# Patient Record
Sex: Female | Born: 1955 | Race: Black or African American | Hispanic: No | Marital: Single | Smoking: Current every day smoker
Health system: Southern US, Community
[De-identification: ages and names within clinical notes are randomized; demographics above are authoritative.]

## PROBLEM LIST (undated history)

## (undated) DIAGNOSIS — E7800 Pure hypercholesterolemia, unspecified: Secondary | ICD-10-CM

## (undated) DIAGNOSIS — I10 Essential (primary) hypertension: Secondary | ICD-10-CM

## (undated) DIAGNOSIS — K219 Gastro-esophageal reflux disease without esophagitis: Secondary | ICD-10-CM

## (undated) HISTORY — DX: Pure hypercholesterolemia, unspecified: E78.00

## (undated) HISTORY — DX: Gastro-esophageal reflux disease without esophagitis: K21.9

## (undated) HISTORY — DX: Essential (primary) hypertension: I10

---

## 2007-04-15 HISTORY — PX: OTHER SURGICAL HISTORY: SHX169

## 2007-05-23 ENCOUNTER — Other Ambulatory Visit: Payer: Self-pay

## 2007-05-23 ENCOUNTER — Inpatient Hospital Stay: Payer: Self-pay | Admitting: Internal Medicine

## 2007-05-24 ENCOUNTER — Other Ambulatory Visit: Payer: Self-pay

## 2007-05-25 ENCOUNTER — Other Ambulatory Visit: Payer: Self-pay

## 2007-06-17 ENCOUNTER — Ambulatory Visit: Payer: Self-pay | Admitting: Cardiovascular Disease

## 2009-07-17 IMAGING — CR DG CHEST 1V PORT
1 series · 1 of 1 positions shown · non-contrast
Comparison: none

REASON FOR EXAM: epigastric pain
COMMENTS:

[view not recorded]
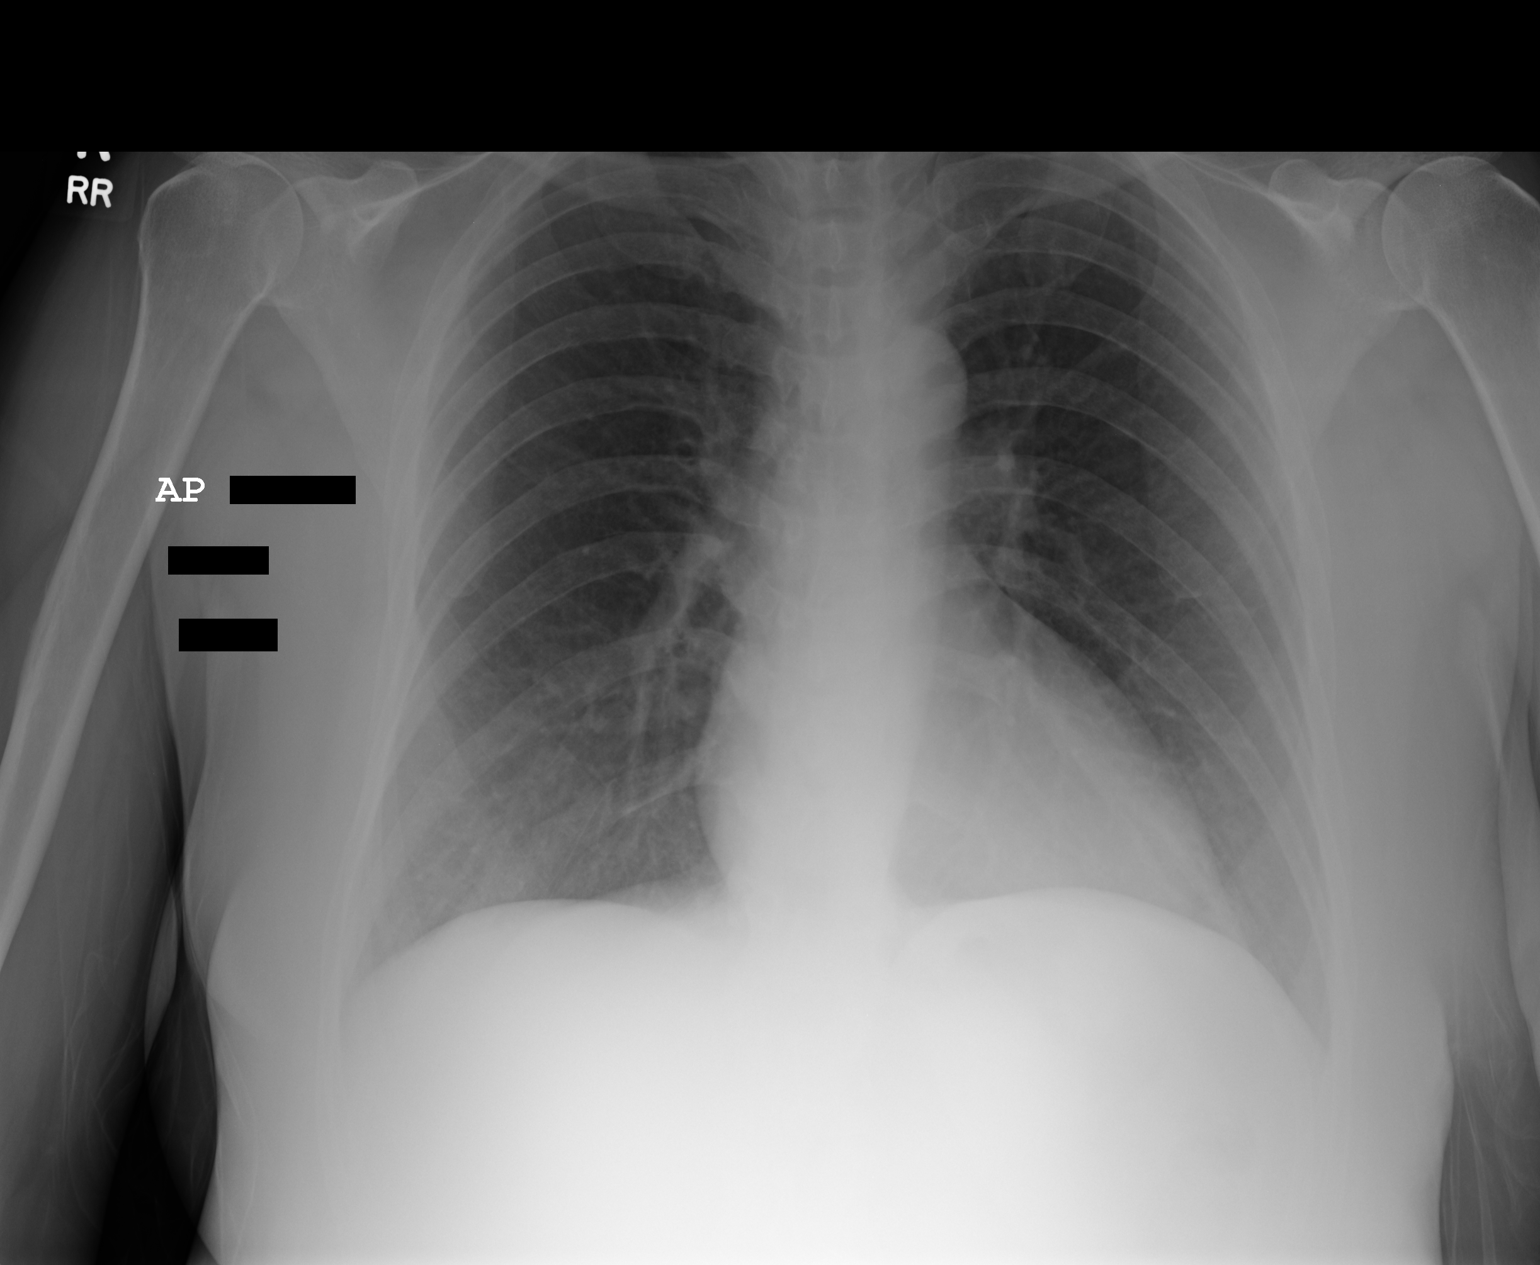

[1 of 1 positions shown; findings below may reference images not displayed]

PROCEDURE:     DXR - DXR PORTABLE CHEST SINGLE VIEW  - May 23, 2007  [DATE]

RESULT:     There is no previous exam for comparison.

The lungs are clear. The heart and pulmonary vessels are normal. The bony
and mediastinal structures are unremarkable. There is no effusion. There is
no pneumothorax or evidence of congestive failure.
IMPRESSION: No acute cardiopulmonary disease.

## 2010-01-29 DIAGNOSIS — I10 Essential (primary) hypertension: Secondary | ICD-10-CM | POA: Insufficient documentation

## 2010-10-29 DIAGNOSIS — J309 Allergic rhinitis, unspecified: Secondary | ICD-10-CM | POA: Insufficient documentation

## 2011-03-12 ENCOUNTER — Emergency Department: Payer: Self-pay

## 2011-03-13 ENCOUNTER — Emergency Department: Payer: Self-pay | Admitting: Emergency Medicine

## 2013-08-01 DIAGNOSIS — Z720 Tobacco use: Secondary | ICD-10-CM | POA: Insufficient documentation

## 2013-10-24 DIAGNOSIS — M7580 Other shoulder lesions, unspecified shoulder: Secondary | ICD-10-CM | POA: Insufficient documentation

## 2014-03-23 DIAGNOSIS — F1910 Other psychoactive substance abuse, uncomplicated: Secondary | ICD-10-CM | POA: Insufficient documentation

## 2014-04-24 DIAGNOSIS — N361 Urethral diverticulum: Secondary | ICD-10-CM | POA: Insufficient documentation

## 2016-06-24 ENCOUNTER — Other Ambulatory Visit: Payer: Self-pay | Admitting: Family Medicine

## 2016-06-24 DIAGNOSIS — Z1239 Encounter for other screening for malignant neoplasm of breast: Secondary | ICD-10-CM

## 2018-03-15 ENCOUNTER — Ambulatory Visit: Payer: Self-pay | Admitting: Obstetrics and Gynecology

## 2018-03-26 ENCOUNTER — Ambulatory Visit (INDEPENDENT_AMBULATORY_CARE_PROVIDER_SITE_OTHER): Payer: Medicare HMO | Admitting: Obstetrics and Gynecology

## 2018-03-26 ENCOUNTER — Encounter: Payer: Self-pay | Admitting: Obstetrics and Gynecology

## 2018-03-26 ENCOUNTER — Other Ambulatory Visit (HOSPITAL_COMMUNITY)
Admission: RE | Admit: 2018-03-26 | Discharge: 2018-03-26 | Disposition: A | Discharge status: Home / Self Care | Payer: Medicare HMO | Source: Ambulatory Visit | Attending: Obstetrics and Gynecology | Admitting: Obstetrics and Gynecology

## 2018-03-26 VITALS — BP 140/66 | HR 96 | Ht 64.0 in | Wt 145.0 lb

## 2018-03-26 DIAGNOSIS — Z1239 Encounter for other screening for malignant neoplasm of breast: Secondary | ICD-10-CM

## 2018-03-26 DIAGNOSIS — Z124 Encounter for screening for malignant neoplasm of cervix: Secondary | ICD-10-CM

## 2018-03-26 DIAGNOSIS — Z01419 Encounter for gynecological examination (general) (routine) without abnormal findings: Secondary | ICD-10-CM | POA: Diagnosis not present

## 2018-03-26 DIAGNOSIS — Z959 Presence of cardiac and vascular implant and graft, unspecified: Secondary | ICD-10-CM

## 2018-03-26 DIAGNOSIS — I739 Peripheral vascular disease, unspecified: Secondary | ICD-10-CM

## 2018-03-26 DIAGNOSIS — Z9889 Other specified postprocedural states: Secondary | ICD-10-CM

## 2018-03-26 NOTE — Progress Notes (Signed)
Gynecology Annual Exam  PCP: Patient, No Pcp Per  Chief Complaint:  Chief Complaint  Patient presents with  . Vaginal pressure    Ovarian cyst/painful intercourse    History of Present Illness:Patient is a 62 y.o. G2P2 presents for annual exam. The patient has no complaints today.   LMP: No LMP recorded. Patient is postmenopausal. No PMB  2The patient does perform self breast exams.  There is no notable family history of breast or ovarian cancer in her family.  The patient wears seatbelts: yes.   The patient has regular exercise: not asked.    The patient denies current symptoms of depression.     Review of Systems: Review of Systems  Constitutional: Negative for chills and fever.  HENT: Negative for congestion.   Respiratory: Negative for cough and shortness of breath.   Cardiovascular: Positive for claudication. Negative for chest pain and palpitations.  Gastrointestinal: Negative for abdominal pain, constipation, diarrhea, heartburn, nausea and vomiting.  Genitourinary: Positive for frequency and urgency. Negative for dysuria, flank pain and hematuria.  Musculoskeletal: Positive for myalgias.  Skin: Negative for itching and rash.  Neurological: Negative for dizziness and headaches.  Endo/Heme/Allergies: Negative for polydipsia.  Psychiatric/Behavioral: Negative for depression.    Past Medical History:  Past Medical History:  Diagnosis Date  . GERD (gastroesophageal reflux disease)   . Hypercholesteremia   . Hypertension     Past Surgical History:  Past Surgical History:  Procedure Laterality Date  . Stent implant  [facility]  Right Iliac     Gynecologic History:  No LMP recorded. Patient is postmenopausal. Last mammogram: 10/26/2017 Results were: [facility]  Obstetric History: G2P2  Family History:  Family History  Problem Relation Age of Onset  . Pancreatic cancer Mother   . Throat cancer Father   . Throat cancer Sister     Social  History:  Social History   Socioeconomic History  . Marital status: Single    Spouse name: Not on file  . Number of children: Not on file  . Years of education: Not on file  . Highest education level: Not on file  Occupational History  . Not on file  Social Needs  . Financial resource strain: Not on file  . Food insecurity:    Worry: Not on file    Inability: Not on file  . Transportation needs:    Medical: Not on file    Non-medical: Not on file  Tobacco Use  . Smoking status: Current Every Day Smoker    Types: Cigarettes  . Smokeless tobacco: Never Used  Substance and Sexual Activity  . Alcohol use: Yes    Comment: Occ  . Drug use: Yes    Types: Cocaine  . Sexual activity: Not Currently    Partners: Male    Birth control/protection: None  Lifestyle  . Physical activity:    Days per week: Not on file    Minutes per session: Not on file  . Stress: Not on file  Relationships  . Social connections:    Talks on phone: Not on file    Gets together: Not on file    Attends religious service: Not on file    Active member of club or organization: Not on file    Attends meetings of clubs or organizations: Not on file    Relationship status: Not on file  . Intimate partner violence:    Fear of current or ex partner: Not on  file    Emotionally abused: Not on file    Physically abused: Not on file    Forced sexual activity: Not on file  Other Topics Concern  . Not on file  Social History Narrative  . Not on file    Allergies:  Allergies  Allergen Reactions  . Sulfa Antibiotics Swelling    Medications: Prior to Admission medications   Medication Sig Start Date End Date Taking? Authorizing Provider  albuterol (PROVENTIL HFA;VENTOLIN HFA) 108 (90 Base) MCG/ACT inhaler Inhale into the lungs. 09/30/17 09/30/18 Yes [provider]  amlodipine-atorvastatin (CADUET) 10-10 MG tablet Take 1 tablet by mouth daily.   Yes [provider]  atorvastatin  (LIPITOR) 20 MG tablet Take by mouth. 05/04/15  Yes [provider]  bisacodyl (DULCOLAX) 5 MG EC tablet Take by mouth.   Yes [provider]  fluticasone (FLONASE) 50 MCG/ACT nasal spray SPRAY 1 TO 2 SPRAYS IN EACH NOSTRIL AT BEDTIME 05/28/16  Yes [provider]  guaifenesin (ROBITUSSIN) 100 MG/5ML syrup Take by mouth. 09/30/17  Yes [provider]  losartan (COZAAR) 50 MG tablet Take by mouth. 01/10/16  Yes [provider]  nitroGLYCERIN (NITROSTAT) 0.4 MG SL tablet PLACE 1 TAB UNDER TONGUE EVERY 5 MIN AS NEEDED FOR CHEST PAIN-IF USE 3 TABS OR PAIN PERSISTS-GO TO E 10/31/15  Yes [provider]  omeprazole (PRILOSEC) 20 MG capsule Take by mouth. 12/19/13  Yes [provider]  oxybutynin (DITROPAN) 5 MG tablet Take 5 mg by mouth 3 (three) times daily.   Yes [provider]  ranitidine (ZANTAC) 150 MG tablet Take by mouth. 02/26/15  Yes [provider]    Physical Exam Vitals: Blood pressure 140/66, pulse 96, height 5\' 4"  (1.626 m), weight 145 lb (65.8 kg).  General: NAD HEENT: normocephalic, anicteric Thyroid: no enlargement, no palpable nodules Pulmonary: No increased work of breathing, CTAB Cardiovascular: RRR, distal pulses 2+ Breast: Breast symmetrical, no tenderness, no palpable nodules or masses, no skin or nipple retraction present, no nipple discharge.  No axillary or supraclavicular lymphadenopathy. Abdomen: NABS, soft, non-tender, non-distended.  Umbilicus without lesions.  No hepatomegaly, splenomegaly or masses palpable. No evidence of hernia  Genitourinary:  External: Normal external female genitalia.  Normal urethral meatus, normal Bartholin's and Skene's glands.    Vagina: Normal vaginal mucosa, no evidence of prolapse.    Cervix: Grossly normal in appearance, no bleeding  Uterus: Non-enlarged, mobile, normal contour.  No CMT  Adnexa: ovaries non-enlarged, no adnexal masses  Rectal:  deferred  Lymphatic: no evidence of inguinal lymphadenopathy Extremities: no edema, erythema, or tenderness Neurologic: Grossly intact Psychiatric: mood appropriate, affect full  Female chaperone present for pelvic and breast  portions of the physical exam     Assessment: 62 y.o. 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  Mammogram Is up to date  2) STI screening  was notoffered and therefore not obtained  3) ASCCP guidelines  and rational discussed.  Patient opts for every 3 years screening interval  4) Osteoporosis  - per USPTF routine screening DEXA at age 30  5) Routine healthcare maintenance including cholesterol, diabetes screening discussed managed by PCP  6) Colonoscopy -  states had 2 years ago [facility] or [facility].  I only see [facility] colonoscopy documented 2009 in [facility] records  8) Had a fall recently - does report leg numbness and tingling referral to vascular .  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    Vena Austria, MD Domingo Pulse, [facility] 03/26/2018, 9:32 AM

## 2018-03-31 ENCOUNTER — Encounter (INDEPENDENT_AMBULATORY_CARE_PROVIDER_SITE_OTHER): Payer: Medicare HMO | Admitting: Vascular Surgery

## 2018-04-02 LAB — CYTOLOGY - PAP: HPV: NOT DETECTED

## 2018-04-05 ENCOUNTER — Telehealth: Payer: Self-pay | Admitting: Obstetrics and Gynecology

## 2018-04-05 ENCOUNTER — Encounter (INDEPENDENT_AMBULATORY_CARE_PROVIDER_SITE_OTHER): Payer: Self-pay | Admitting: Nurse Practitioner

## 2018-04-05 ENCOUNTER — Ambulatory Visit (INDEPENDENT_AMBULATORY_CARE_PROVIDER_SITE_OTHER): Payer: Medicare HMO | Admitting: Nurse Practitioner

## 2018-04-05 VITALS — BP 129/70 | HR 91 | Resp 14 | Ht 64.0 in | Wt 139.6 lb

## 2018-04-05 DIAGNOSIS — Z720 Tobacco use: Secondary | ICD-10-CM

## 2018-04-05 DIAGNOSIS — I739 Peripheral vascular disease, unspecified: Secondary | ICD-10-CM

## 2018-04-05 DIAGNOSIS — I10 Essential (primary) hypertension: Secondary | ICD-10-CM

## 2018-04-05 DIAGNOSIS — E782 Mixed hyperlipidemia: Secondary | ICD-10-CM | POA: Diagnosis not present

## 2018-04-05 NOTE — Telephone Encounter (Signed)
Patient requesting annual lab work including A1C, she was seen on 12/13 for NP consult.

## 2018-04-06 ENCOUNTER — Other Ambulatory Visit: Payer: Self-pay | Admitting: Obstetrics and Gynecology

## 2018-04-06 DIAGNOSIS — Z131 Encounter for screening for diabetes mellitus: Secondary | ICD-10-CM

## 2018-04-06 DIAGNOSIS — Z13220 Encounter for screening for lipoid disorders: Secondary | ICD-10-CM

## 2018-04-06 DIAGNOSIS — Z1329 Encounter for screening for other suspected endocrine disorder: Secondary | ICD-10-CM

## 2018-04-06 NOTE — Telephone Encounter (Signed)
Put in future orders if we can schedule her lab visit

## 2018-04-08 ENCOUNTER — Encounter (INDEPENDENT_AMBULATORY_CARE_PROVIDER_SITE_OTHER): Payer: Self-pay | Admitting: Nurse Practitioner

## 2018-04-08 NOTE — Progress Notes (Signed)
Subjective:    Patient ID: Rebekah Perry, female    DOB: 07-01-1955, 62 y.o.   MRN: [id number] Chief Complaint  Patient presents with  . New Patient (Initial Visit)    HPI  Rebekah Perry is a 62 y.o. female that presents as a new patient that is referred by Dr. Bonney Aid.  The patient states that essentially she has multiple issues with pain in her bilateral lower extremities.  She states that when she lays on her right side her right lower extremity goes numb.  She also has extensive cramping at night of her legs and feet.  She states that when she is ambulating she has bilateral leg weakness which necessitates her needing to stop and take a break at multiple intervals.  She states that she can walk approximately 100 to 200 feet reporting to stop.  The patient is unsure of the dates but endorses that she has had multiple vascular interventions.  She states that she has stents in both of her legs.  She states that she has had a bypass, pointing to a long midline scar.  She has some stent cards which indicates stents in her iliac arteries.  The patient is a current smoker.  Patient denies any wounds or ulcerations of her lower extremities.  She denies any numbness or tingling of her bilateral lower extremities.  She denies any previous history of DVT.  She denies any chest pain or shortness of breath.  She denies any fever, chills, nausea, vomiting or diarrhea.  Past Medical History:  Diagnosis Date  . GERD (gastroesophageal reflux disease)   . Hypercholesteremia   . Hypertension     Past Surgical History:  Procedure Laterality Date  . Stent implant  2009   [facility]  Right Iliac     Social History   Socioeconomic History  . Marital status: Single    Spouse name: Not on file  . Number of children: Not on file  . Years of education: Not on file  . Highest education level: Not on file  Occupational History  . Not on file  Social Needs  . Financial resource strain: Not on  file  . Food insecurity:    Worry: Not on file    Inability: Not on file  . Transportation needs:    Medical: Not on file    Non-medical: Not on file  Tobacco Use  . Smoking status: Current Every Day Smoker    Types: Cigarettes  . Smokeless tobacco: Never Used  Substance and Sexual Activity  . Alcohol use: Yes    Comment: Occ  . Drug use: Yes    Types: Cocaine  . Sexual activity: Not Currently    Partners: Male    Birth control/protection: None  Lifestyle  . Physical activity:    Days per week: Not on file    Minutes per session: Not on file  . Stress: Not on file  Relationships  . Social connections:    Talks on phone: Not on file    Gets together: Not on file    Attends religious service: Not on file    Active member of club or organization: Not on file    Attends meetings of clubs or organizations: Not on file    Relationship status: Not on file  . Intimate partner violence:    Fear of current or ex partner: Not on file    Emotionally abused: Not on file    Physically abused: Not on file  Forced sexual activity: Not on file  Other Topics Concern  . Not on file  Social History Narrative  . Not on file    Family History  Problem Relation Age of Onset  . Pancreatic cancer Mother   . Throat cancer Father   . Throat cancer Sister     Allergies  Allergen Reactions  . Sulfa Antibiotics Swelling     Review of Systems   Review of Systems: Negative Unless Checked Constitutional: [] Weight loss  [] Fever  [] Chills Cardiac: [] Chest pain   []  Atrial Fibrillation  [] Palpitations   [] Shortness of breath when laying flat   [] Shortness of breath with exertion. [] Shortness of breath at rest Vascular:  [x] Pain in legs with walking   [] Pain in legs with standing [] Pain in legs when laying flat   [x] Claudication    [x] Pain in feet when laying flat    [] History of DVT   [] Phlebitis   [] Swelling in legs   [] Varicose veins   [] Non-healing ulcers Pulmonary:   [] Uses home  oxygen   [] Productive cough   [] Hemoptysis   [] Wheeze  [] COPD   [] Asthma Neurologic:  [] Dizziness   [] Seizures  [] Blackouts [] History of stroke   [] History of TIA  [] Aphasia   [] Temporary Blindness   [] Weakness or numbness in arm   [x] Weakness or numbness in leg Musculoskeletal:   [] Joint swelling   [] Joint pain   [] Low back pain  []  History of Knee Replacement [] Arthritis [] back Surgeries  []  Spinal Stenosis    Hematologic:  [] Easy bruising  [] Easy bleeding   [] Hypercoagulable state   [] Anemic Gastrointestinal:  [] Diarrhea   [] Vomiting  [x] Gastroesophageal reflux/heartburn   [] Difficulty swallowing. [] Abdominal pain Genitourinary:  [] Chronic kidney disease   [] Difficult urination  [] Anuric   [] Blood in urine [x] Frequent urination  [] Burning with urination   [] Hematuria Skin:  [] Rashes   [] Ulcers [] Wounds Psychological:  [] History of anxiety   []  History of major depression  []  Memory Difficulties     Objective:   Physical Exam  BP 129/70 (BP Location: Right Arm, Patient Position: Sitting)   Pulse 91   Resp 14   Ht 5\' 4"  (1.626 m)   Wt 139 lb 9.6 oz (63.3 kg)   BMI 23.96 kg/m   Gen: WD/WN, NAD Head: [place]/AT, No temporalis wasting.  Ear/Nose/Throat: Hearing grossly intact, nares w/o erythema or drainage Eyes: PER, EOMI, sclera nonicteric.  Neck: Supple, no masses.  No JVD.  Pulmonary:  Good air movement, no use of accessory muscles.  Cardiac: RRR Vascular:  Vessel Right Left  Radial Palpable Palpable  Dorsalis Pedis Not Palpable Not Palpable  Posterior Tibial Not Palpable Not Palpable   Gastrointestinal: soft, non-distended. No guarding/no peritoneal signs.  Midline scar Musculoskeletal: M/S 5/5 throughout.  No deformity or atrophy.  Neurologic: Pain and light touch intact in extremities.  Symmetrical.  Speech is fluent. Motor exam as listed above. Psychiatric: Judgment intact, Mood & affect appropriate for pt's clinical situation. Dermatologic: No Venous rashes. No Ulcers Noted.   No changes consistent with cellulitis. Lymph : No Cervical lymphadenopathy, no lichenification or skin changes of chronic lymphedema.      Assessment & Plan:   1. Peripheral vascular disease (HCC) Based upon the patient's description of previous surgery I believe what she is describing is an aortobifemoral bypass.  [facility], it appears that at some point in 2015 it was discovered that she had an occlusion of her aortobifem.  There was also evidence of some collateralization.  It also appears that since that time she has not had any follow-up studies it is also unclear as to whether she has been under the care of a vascular physician since that time.  We will obtain bilateral ABIs to establish a baseline.  We will also get bilateral lower extremity arterial duplexes as well as an aortoiliac duplex to determine the status of her bypass grafts, stents as well as current status of her flows.  Depending on the findings of these studies we will determine what the next steps will be, such as further noninvasive imaging or a possible diagnostic angiogram.  She will follow-up with me following the studies.  - VAS US AORTA/IVC/ILIACS; Future - VAS US LOWER EXTREMITY ARTERIAL DUPLEX; Future - VAS US ABI WITH/WO TBI; Future  2. Essential hypertension Continue antihypertensive medications as already ordered, these medications have been reviewed and there are no changes at this time.   3. Mixed hyperlipidemia Continue statin as ordered and reviewed, no changes at this time   4. Tobacco abuse Smoking cessation was discussed, 3-10 minutes spent on this topic specifically    Current Outpatient Medications on File Prior to Visit  Medication Sig Dispense Refill  . albuterol (PROVENTIL HFA;VENTOLIN HFA) 108 (90 Base) MCG/ACT inhaler Inhale into the lungs.    Marland Kitchen. amlodipine-atorvastatin (CADUET) 10-10 MG tablet Take 1 tablet by mouth daily.    Marland Kitchen. atorvastatin (LIPITOR) 20 MG  tablet Take by mouth.    . bisacodyl (DULCOLAX) 5 MG EC tablet Take by mouth.    . fluticasone (FLONASE) 50 MCG/ACT nasal spray SPRAY 1 TO 2 SPRAYS IN EACH NOSTRIL AT BEDTIME    . losartan (COZAAR) 50 MG tablet Take by mouth.    . nitroGLYCERIN (NITROSTAT) 0.4 MG SL tablet PLACE 1 TAB UNDER TONGUE EVERY 5 MIN AS NEEDED FOR CHEST PAIN-IF USE 3 TABS OR PAIN PERSISTS-GO TO E    . omeprazole (PRILOSEC) 20 MG capsule Take by mouth.    . oxybutynin (DITROPAN) 5 MG tablet Take 5 mg by mouth 3 (three) times daily.    . ranitidine (ZANTAC) 150 MG tablet Take by mouth.     No current facility-administered medications on file prior to visit.     There are no Patient Instructions on file for this visit. No follow-ups on file.   Georgiana SpinnerFallon E Joniece Smotherman, NP  This note was completed with Office managerDragon Dictation.  Any errors are purely unintentional.

## 2018-04-13 ENCOUNTER — Other Ambulatory Visit: Payer: Medicare HMO

## 2018-04-13 ENCOUNTER — Telehealth: Payer: Self-pay | Admitting: Obstetrics and Gynecology

## 2018-04-13 DIAGNOSIS — Z1329 Encounter for screening for other suspected endocrine disorder: Secondary | ICD-10-CM

## 2018-04-13 DIAGNOSIS — Z131 Encounter for screening for diabetes mellitus: Secondary | ICD-10-CM

## 2018-04-13 DIAGNOSIS — Z13220 Encounter for screening for lipoid disorders: Secondary | ICD-10-CM

## 2018-04-13 NOTE — Telephone Encounter (Signed)
Result was abnormal, however, I do not know his plan for future testing. Deferring to AMS to review and call pt at his return to office. KJ CMA

## 2018-04-13 NOTE — Telephone Encounter (Signed)
Patient would like call regarding pap results, AMS had called her but she cannot remember results (?mass).  Patient aware AMS out of office until 1/6.  If nurse could call or she will wait until his return.

## 2018-04-14 LAB — CMP14+LP+TP+TSH+CBC/PLT
ALT: 10 IU/L (ref 0–32)
AST: 11 IU/L (ref 0–40)
Albumin/Globulin Ratio: 1.4 (ref 1.2–2.2)
Albumin: 3.9 g/dL (ref 3.6–4.8)
Alkaline Phosphatase: 81 IU/L (ref 39–117)
BUN/Creatinine Ratio: 11 — ABNORMAL LOW (ref 12–28)
BUN: 8 mg/dL (ref 8–27)
Bilirubin Total: 0.3 mg/dL (ref 0.0–1.2)
CO2: 24 mmol/L (ref 20–29)
Calcium: 9.7 mg/dL (ref 8.7–10.3)
Chloride: 104 mmol/L (ref 96–106)
Cholesterol, Total: 168 mg/dL (ref 100–199)
Creatinine, Ser: 0.74 mg/dL (ref 0.57–1.00)
Free Thyroxine Index: 1.6 (ref 1.2–4.9)
GFR calc Af Amer: 100 mL/min/{1.73_m2} (ref >59)
GFR calc non Af Amer: 87 mL/min/{1.73_m2} (ref >59)
Globulin, Total: 2.7 g/dL (ref 1.5–4.5)
Glucose: 97 mg/dL (ref 65–99)
HDL: 58 mg/dL (ref >39)
Hematocrit: 39.2 % (ref 34.0–46.6)
Hemoglobin: 12.9 g/dL (ref 11.1–15.9)
LDL Calculated: 77 mg/dL (ref 0–99)
LDl/HDL Ratio: 1.3 ratio (ref 0.0–3.2)
MCH: 27.7 pg (ref 26.6–33.0)
MCHC: 32.9 g/dL (ref 31.5–35.7)
MCV: 84 fL (ref 79–97)
Platelets: 318 10*3/uL (ref 150–450)
Potassium: 3.9 mmol/L (ref 3.5–5.2)
RBC: 4.66 x10E6/uL (ref 3.77–5.28)
RDW: 15.6 % — ABNORMAL HIGH (ref 12.3–15.4)
Sodium: 144 mmol/L (ref 134–144)
T3 Uptake Ratio: 25 % (ref 24–39)
T4, Total: 6.5 ug/dL (ref 4.5–12.0)
TSH: 4 u[IU]/mL (ref 0.450–4.500)
Total Protein: 6.6 g/dL (ref 6.0–8.5)
Triglycerides: 167 mg/dL — ABNORMAL HIGH (ref 0–149)
VLDL Cholesterol Cal: 33 mg/dL (ref 5–40)
WBC: 8.4 10*3/uL (ref 3.4–10.8)

## 2018-04-14 LAB — HEMOGLOBIN A1C
Est. average glucose Bld gHb Est-mCnc: 126 mg/dL
HEMOGLOBIN A1C: 6 % — AB (ref 4.8–5.6)

## 2018-04-19 NOTE — Telephone Encounter (Signed)
Patient is calling for labs. Please advise

## 2018-04-28 ENCOUNTER — Encounter (INDEPENDENT_AMBULATORY_CARE_PROVIDER_SITE_OTHER): Payer: Self-pay | Admitting: Nurse Practitioner

## 2018-04-28 ENCOUNTER — Ambulatory Visit (INDEPENDENT_AMBULATORY_CARE_PROVIDER_SITE_OTHER): Payer: Medicare HMO | Admitting: Vascular Surgery

## 2018-04-28 ENCOUNTER — Ambulatory Visit (INDEPENDENT_AMBULATORY_CARE_PROVIDER_SITE_OTHER): Payer: Medicare HMO

## 2018-04-28 VITALS — BP 147/68 | HR 85 | Resp 16 | Ht 64.0 in | Wt 143.6 lb

## 2018-04-28 DIAGNOSIS — E782 Mixed hyperlipidemia: Secondary | ICD-10-CM | POA: Diagnosis not present

## 2018-04-28 DIAGNOSIS — I739 Peripheral vascular disease, unspecified: Secondary | ICD-10-CM

## 2018-04-28 DIAGNOSIS — I10 Essential (primary) hypertension: Secondary | ICD-10-CM | POA: Diagnosis not present

## 2018-04-28 DIAGNOSIS — Z720 Tobacco use: Secondary | ICD-10-CM

## 2018-04-28 DIAGNOSIS — R1084 Generalized abdominal pain: Secondary | ICD-10-CM

## 2018-05-03 ENCOUNTER — Encounter (INDEPENDENT_AMBULATORY_CARE_PROVIDER_SITE_OTHER): Payer: Self-pay | Admitting: Vascular Surgery

## 2018-05-03 DIAGNOSIS — R1084 Generalized abdominal pain: Secondary | ICD-10-CM | POA: Insufficient documentation

## 2018-05-03 NOTE — Progress Notes (Signed)
Subjective:    Patient ID: Rebekah Perry, female    DOB: 01/23/1956, 63 y.o.   MRN: [id number] Chief Complaint  Patient presents with  . Follow-up   Patient presents to review vascular studies.  Patient was last seen on April 05, 2018 in regard to reestablishing care.  The patient has a known past medical history of peripheral artery disease requiring endovascular and open repair.  The patient presents today without complaint with the exception of some generalized abdominal pain.  Patient states she feels like she is unable to " digest her food".  Patient experiences some "nausea" after eating however denies any vomiting. Patient denies any claudication-like symptoms, rest pain or ulcer formation to the bilateral lower extremity.  The patient underwent a bilateral ABI which is notable for monophasic tibials and abnormal great toe pressure waveforms.  Right ABI: 0.52, Left ABI:0.55.  The patient underwent an aortoiliac and bilateral lower extremity arterial duplex which was notable for monophasic blood flow throughout.  Patient denies any fever, nausea vomiting.  Review of Systems  Constitutional: Negative.   HENT: Negative.   Eyes: Negative.   Respiratory: Negative.   Cardiovascular: Negative.   Gastrointestinal: Negative.   Endocrine: Negative.   Genitourinary: Negative.   Musculoskeletal: Negative.   Skin: Negative.   Allergic/Immunologic: Negative.   Neurological: Negative.   Hematological: Negative.   Psychiatric/Behavioral: Negative.       Objective:   Physical Exam Constitutional:      Appearance: Normal appearance. She is normal weight.  HENT:     Head: Normocephalic and atraumatic.     Right Ear: External ear normal.     Left Ear: External ear normal.     Nose: Nose normal.     Mouth/Throat:     Mouth: Mucous membranes are dry.     Pharynx: Oropharynx is clear.  Eyes:     Extraocular Movements: Extraocular movements intact.     Conjunctiva/sclera: Conjunctivae  normal.     Pupils: Pupils are equal, round, and reactive to light.  Neck:     Musculoskeletal: Normal range of motion.  Cardiovascular:     Rate and Rhythm: Normal rate and regular rhythm.     Comments: Hard to palpate pedal pulses bilaterally however the bilateral feet are warm Pulmonary:     Effort: Pulmonary effort is normal.     Breath sounds: Normal breath sounds.  Musculoskeletal: Normal range of motion.        General: Swelling (Mild bilateral nonpitting edema noted) present.  Skin:    General: Skin is warm and dry.  Neurological:     General: No focal deficit present.     Mental Status: She is alert and oriented to person, place, and time. Mental status is at baseline.  Psychiatric:        Mood and Affect: Mood normal.        Behavior: Behavior normal.        Thought Content: Thought content normal.        Judgment: Judgment normal.    BP (!) 147/68 (BP Location: Right Arm, Patient Position: Sitting)   Pulse 85   Resp 16   Ht 5\' 4"  (1.626 m)   Wt 143 lb 9.6 oz (65.1 kg)   BMI 24.65 kg/m   Past Medical History:  Diagnosis Date  . GERD (gastroesophageal reflux disease)   . Hypercholesteremia   . Hypertension    Social History   Socioeconomic History  . Marital status: Single  Spouse name: Not on file  . Number of children: Not on file  . Years of education: Not on file  . Highest education level: Not on file  Occupational History  . Not on file  Social Needs  . Financial resource strain: Not on file  . Food insecurity:    Worry: Not on file    Inability: Not on file  . Transportation needs:    Medical: Not on file    Non-medical: Not on file  Tobacco Use  . Smoking status: Current Every Day Smoker    Types: Cigarettes  . Smokeless tobacco: Never Used  Substance and Sexual Activity  . Alcohol use: Yes    Comment: Occ  . Drug use: Yes    Types: Cocaine  . Sexual activity: Not Currently    Partners: Male    Birth control/protection: None    Lifestyle  . Physical activity:    Days per week: Not on file    Minutes per session: Not on file  . Stress: Not on file  Relationships  . Social connections:    Talks on phone: Not on file    Gets together: Not on file    Attends religious service: Not on file    Active member of club or organization: Not on file    Attends meetings of clubs or organizations: Not on file    Relationship status: Not on file  . Intimate partner violence:    Fear of current or ex partner: Not on file    Emotionally abused: Not on file    Physically abused: Not on file    Forced sexual activity: Not on file  Other Topics Concern  . Not on file  Social History Narrative  . Not on file   Past Surgical History:  Procedure Laterality Date  . Stent implant  2009   [facility]  Right Iliac    Family History  Problem Relation Age of Onset  . Pancreatic cancer Mother   . Throat cancer Father   . Throat cancer Sister    Allergies  Allergen Reactions  . Sulfa Antibiotics Swelling      Assessment & Plan:  Patient presents to review vascular studies.  Patient was last seen on April 05, 2018 in regard to reestablishing care.  The patient has a known past medical history of peripheral artery disease requiring endovascular and open repair.  The patient presents today without complaint with the exception of some generalized abdominal pain.  Patient states she feels like she is unable to " digest her food".  Patient experiences some "nausea" after eating however denies any vomiting. Patient denies any claudication-like symptoms, rest pain or ulcer formation to the bilateral lower extremity.  The patient underwent a bilateral ABI which is notable for monophasic tibials and abnormal great toe pressure waveforms.  Right ABI: 0.52, Left ABI:0.55.  The patient underwent an aortoiliac and bilateral lower extremity arterial duplex which was notable for monophasic blood flow throughout.  Patient denies any fever,  nausea vomiting.  1. Peripheral vascular disease (HCC) - Stable Patient with a known history of peripheral artery disease Patient with monophasic blood flow in the aorta throughout the iliacs and distally At this time, the patient is not complaining of any claudication-like symptoms, rest pain or ulcer formation We did have a long discussion about possibly moving forward with a bilateral lower extremity angiogram to improve her blood flow however at this time the patient does not want to move  forward The patient does understand that this type of disease is progressive and she may to undergo one in the near future I will bring the patient back in 6 months to repeat studies to surveilled her disease We discussed the symptoms of peripheral artery disease such as claudication, rest pain or ulcer formation.  The patient knows to call the office sooner if she starts to experience any of the symptoms  - VAS [place] ABI WITH/WO TBI; Future - VAS US AORTA/IVC/ILIACS; Future - VAS [place] LOWER EXTREMITY ARTERIAL DUPLEX; Future  2. Essential hypertension - Stable Encouraged good control as its slows the progression of atherosclerotic disease  3. Tobacco abuse - Stable We had a discussion for approximately three minutes regarding the absolute need for smoking cessation due to the deleterious nature of tobacco on the vascular system. We discussed the tobacco use would diminish patency of any intervention, and likely significantly worsen progressio of disease. We discussed multiple agents for quitting including replacement therapy or medications to reduce cravings such as Chantix. The patient voices their understanding of the importance of smoking cessation.  4. Mixed hyperlipidemia - Stable Encouraged good control as its slows the progression of atherosclerotic disease  5. Generalized abdominal pain - New Patient is concerned that she may have "blockages" in the arteries in her stomach. Patient does have many  risk factors for mesenteric artery stenosis Patient does experience generalized abdominal pain which she states is progressively worsening I will bring her back at her convenience and have her undergo a mesenteric artery duplex to rule out any contributing mesenteric stenosis  - VAS [place] MESENTERIC DUPLEX; Future  Current Outpatient Medications on File Prior to Visit  Medication Sig Dispense Refill  . albuterol (PROVENTIL HFA;VENTOLIN HFA) 108 (90 Base) MCG/ACT inhaler Inhale into the lungs.    Marland Kitchen amlodipine-atorvastatin (CADUET) 10-10 MG tablet Take 1 tablet by mouth daily.    Marland Kitchen atorvastatin (LIPITOR) 20 MG tablet Take by mouth.    . bisacodyl (DULCOLAX) 5 MG EC tablet Take by mouth.    . fluticasone (FLONASE) 50 MCG/ACT nasal spray SPRAY 1 TO 2 SPRAYS IN EACH NOSTRIL AT BEDTIME    . losartan (COZAAR) 50 MG tablet Take by mouth.    . nitroGLYCERIN (NITROSTAT) 0.4 MG SL tablet PLACE 1 TAB UNDER TONGUE EVERY 5 MIN AS NEEDED FOR CHEST PAIN-IF USE 3 TABS OR PAIN PERSISTS-GO TO E    . omeprazole (PRILOSEC) 20 MG capsule Take by mouth.    . oxybutynin (DITROPAN) 5 MG tablet Take 5 mg by mouth 3 (three) times daily.     No current facility-administered medications on file prior to visit.     There are no Patient Instructions on file for this visit. No follow-ups on file.   Elzina Devera A Brandace Cargle, PA-C

## 2018-05-12 ENCOUNTER — Encounter (INDEPENDENT_AMBULATORY_CARE_PROVIDER_SITE_OTHER): Payer: Self-pay | Admitting: Vascular Surgery

## 2018-05-12 ENCOUNTER — Ambulatory Visit (INDEPENDENT_AMBULATORY_CARE_PROVIDER_SITE_OTHER): Payer: Medicare HMO | Admitting: Vascular Surgery

## 2018-05-12 ENCOUNTER — Ambulatory Visit (INDEPENDENT_AMBULATORY_CARE_PROVIDER_SITE_OTHER): Payer: Medicare HMO

## 2018-05-12 VITALS — BP 116/68 | HR 80 | Resp 16 | Ht 64.0 in | Wt 142.6 lb

## 2018-05-12 DIAGNOSIS — R1084 Generalized abdominal pain: Secondary | ICD-10-CM | POA: Diagnosis not present

## 2018-05-12 DIAGNOSIS — E782 Mixed hyperlipidemia: Secondary | ICD-10-CM | POA: Diagnosis not present

## 2018-05-12 DIAGNOSIS — Z720 Tobacco use: Secondary | ICD-10-CM | POA: Diagnosis not present

## 2018-05-12 DIAGNOSIS — I739 Peripheral vascular disease, unspecified: Secondary | ICD-10-CM | POA: Diagnosis not present

## 2018-05-12 DIAGNOSIS — I10 Essential (primary) hypertension: Secondary | ICD-10-CM

## 2018-05-12 NOTE — Progress Notes (Signed)
Subjective:    Patient ID: Rebekah Perry, female    DOB: 10/26/1955, 63 y.o.   MRN: [id number] Chief Complaint  Patient presents with  . Follow-up   Patient presents to review vascular studies.  The patient was last seen on April 28, 2018 in regard to known peripheral artery disease.  At the time, the patient noted that she experienced intermittent generalized abdominal pain.  Due to her history with peripheral artery disease, we decided to move forward and rule out any mesenteric ischemia as a contributing factor.  The patient notes that her abdominal symptoms are stable.  The patient underwent a mesenteric duplex which was notable for no hemodynamically significant arterial stenosis noted in the cephalic, common hepatic, splenic and superior mesenteric artery.  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  Patient denies any fever, nausea vomiting.  Review of Systems  Constitutional: Negative.   HENT: Negative.   Eyes: Negative.   Respiratory: Negative.   Cardiovascular: Negative.   Gastrointestinal: Positive for abdominal pain.  Endocrine: Negative.   Genitourinary: Negative.   Musculoskeletal: Negative.   Skin: Negative.   Allergic/Immunologic: Negative.   Neurological: Negative.   Hematological: Negative.   Psychiatric/Behavioral: Negative.       Objective:   Physical Exam Vitals signs reviewed.  Constitutional:      Appearance: Normal appearance.  HENT:     Head: Normocephalic and atraumatic.     Right Ear: External ear normal.     Left Ear: External ear normal.     Nose: Nose normal.     Mouth/Throat:     Mouth: Mucous membranes are moist.     Pharynx: Oropharynx is clear.  Eyes:     Extraocular Movements: Extraocular movements intact.     Conjunctiva/sclera: Conjunctivae normal.     Pupils: Pupils are equal, round, and reactive to light.  Neck:     Musculoskeletal: Normal range of motion.  Cardiovascular:     Rate and Rhythm: Normal rate and  regular rhythm.     Comments: To palpate pedal pulses however the bilateral feet are warm. Pulmonary:     Effort: Pulmonary effort is normal.     Breath sounds: Normal breath sounds.  Abdominal:     General: Bowel sounds are normal. There is no distension.     Palpations: Abdomen is soft.     Tenderness: There is no abdominal tenderness. There is no guarding or rebound.  Musculoskeletal: Normal range of motion.        General: No swelling.  Skin:    General: Skin is warm and dry.  Neurological:     General: No focal deficit present.     Mental Status: She is alert and oriented to person, place, and time. Mental status is at baseline.  Psychiatric:        Mood and Affect: Mood normal.        Behavior: Behavior normal.        Thought Content: Thought content normal.        Judgment: Judgment normal.    BP 116/68 (BP Location: Right Arm, Patient Position: Sitting)   Pulse 80   Resp 16   Ht 5\' 4"  (1.626 m)   Wt 142 lb 9.6 oz (64.7 kg)   BMI 24.48 kg/m   Past Medical History:  Diagnosis Date  . GERD (gastroesophageal reflux disease)   . Hypercholesteremia   . Hypertension    Social History   Socioeconomic History  .  Marital status: Single    Spouse name: Not on file  . Number of children: Not on file  . Years of education: Not on file  . Highest education level: Not on file  Occupational History  . Not on file  Social Needs  . Financial resource strain: Not on file  . Food insecurity:    Worry: Not on file    Inability: Not on file  . Transportation needs:    Medical: Not on file    Non-medical: Not on file  Tobacco Use  . Smoking status: Current Every Day Smoker    Types: Cigarettes  . Smokeless tobacco: Never Used  Substance and Sexual Activity  . Alcohol use: Yes    Comment: Occ  . Drug use: Yes    Types: Cocaine  . Sexual activity: Not Currently    Partners: Male    Birth control/protection: None  Lifestyle  . Physical activity:    Days per week:  Not on file    Minutes per session: Not on file  . Stress: Not on file  Relationships  . Social connections:    Talks on phone: Not on file    Gets together: Not on file    Attends religious service: Not on file    Active member of club or organization: Not on file    Attends meetings of clubs or organizations: Not on file    Relationship status: Not on file  . Intimate partner violence:    Fear of current or ex partner: Not on file    Emotionally abused: Not on file    Physically abused: Not on file    Forced sexual activity: Not on file  Other Topics Concern  . Not on file  Social History Narrative  . Not on file   Past Surgical History:  Procedure Laterality Date  . Stent implant  2009   [facility]  Right Iliac    Family History  Problem Relation Age of Onset  . Pancreatic cancer Mother   . Throat cancer Father   . Throat cancer Sister    Allergies  Allergen Reactions  . Sulfa Antibiotics Swelling      Assessment & Plan:  Patient presents to review vascular studies.  The patient was last seen on April 28, 2018 in regard to known peripheral artery disease.  At the time, the patient noted that she experienced intermittent generalized abdominal pain.  Due to her history with peripheral artery disease, we decided to move forward and rule out any mesenteric ischemia as a contributing factor.  The patient notes that her abdominal symptoms are stable.  The patient underwent a mesenteric duplex which was notable for no hemodynamically significant arterial stenosis noted in the cephalic, common hepatic, splenic and superior mesenteric artery.  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  Patient denies any fever, nausea vomiting.  1. Peripheral vascular disease (HCC) - Stable Patient with known aorto-iliac and peripheral artery disease. The patient is status post an aorto bi-femoral bypass in the past. I received an addendum from our ultrasound department today  in regard to the patient's previous aortoiliac exam: " Based on limited visualization, no flow was adequately detected in the area of aortobifemoral bypass graft or the most of the bilateral iliac arterial system with multiple collaterals in the pelvic region.  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 The patient has an appointment in July to surveilled her aortoiliac and peripheral artery disease. The patient knows to call the office sooner if she should start to experience any claudication-like symptoms, rest pain or ulcer formation.  2. Generalized abdominal pain - Stable The patient has no hemodynamically significant mesenteric stenosis or ischemia noted on duplex today. Encourage the patient to follow-up with her primary care physician or gastroenterologist for further work-up She expresses her understanding  3. Mixed hyperlipidemia - Stable On Statin Encouraged good control as its slows the progression of atherosclerotic disease  4. Tobacco abuse - Stable We had a discussion for approximately three minutes regarding the absolute need for smoking cessation due to the deleterious nature of tobacco on the vascular system. We discussed the tobacco use would diminish patency of any intervention, and likely significantly worsen progressio of disease. We discussed multiple agents for quitting including replacement therapy or medications to reduce cravings such as Chantix. The patient voices their understanding of the importance of smoking cessation.  5. Essential hypertension - Stable On appropriate medications Encouraged good control as its slows the progression of atherosclerotic disease  Current Outpatient Medications on File Prior to Visit  Medication Sig Dispense Refill  .  albuterol (PROVENTIL HFA;VENTOLIN HFA) 108 (90 Base) MCG/ACT inhaler Inhale into the lungs.    Marland Kitchen amlodipine-atorvastatin (CADUET) 10-10 MG tablet Take 1 tablet by mouth daily.    Marland Kitchen atorvastatin (LIPITOR) 20 MG tablet Take by mouth.    . bisacodyl (DULCOLAX) 5 MG EC tablet Take by mouth.    . fluticasone (FLONASE) 50 MCG/ACT nasal spray SPRAY 1 TO 2 SPRAYS IN EACH NOSTRIL AT BEDTIME    . losartan (COZAAR) 50 MG tablet Take by mouth.    . nitroGLYCERIN (NITROSTAT) 0.4 MG SL tablet PLACE 1 TAB UNDER TONGUE EVERY 5 MIN AS NEEDED FOR CHEST PAIN-IF USE 3 TABS OR PAIN PERSISTS-GO TO E    . omeprazole (PRILOSEC) 20 MG capsule Take by mouth.    . oxybutynin (DITROPAN) 5 MG tablet Take 5 mg by mouth 3 (three) times daily.     No current facility-administered medications on file prior to visit.    There are no Patient Instructions on file for this visit. No follow-ups on file.  Demetrice Combes A Nuriya Stuck, PA-C

## 2018-10-27 ENCOUNTER — Other Ambulatory Visit: Payer: Self-pay | Admitting: Family Medicine

## 2018-10-27 DIAGNOSIS — Z1231 Encounter for screening mammogram for malignant neoplasm of breast: Secondary | ICD-10-CM

## 2018-10-28 ENCOUNTER — Encounter (INDEPENDENT_AMBULATORY_CARE_PROVIDER_SITE_OTHER): Payer: Self-pay | Admitting: Nurse Practitioner

## 2018-10-28 ENCOUNTER — Ambulatory Visit (INDEPENDENT_AMBULATORY_CARE_PROVIDER_SITE_OTHER): Payer: Medicare HMO | Admitting: Nurse Practitioner

## 2018-10-28 ENCOUNTER — Ambulatory Visit (INDEPENDENT_AMBULATORY_CARE_PROVIDER_SITE_OTHER): Payer: Medicare HMO

## 2018-10-28 ENCOUNTER — Other Ambulatory Visit: Payer: Self-pay

## 2018-10-28 VITALS — BP 135/68 | HR 74 | Resp 16 | Ht 64.0 in | Wt 140.4 lb

## 2018-10-28 DIAGNOSIS — E782 Mixed hyperlipidemia: Secondary | ICD-10-CM

## 2018-10-28 DIAGNOSIS — Z720 Tobacco use: Secondary | ICD-10-CM

## 2018-10-28 DIAGNOSIS — I739 Peripheral vascular disease, unspecified: Secondary | ICD-10-CM

## 2018-10-28 DIAGNOSIS — T82898A Other specified complication of vascular prosthetic devices, implants and grafts, initial encounter: Secondary | ICD-10-CM

## 2018-10-28 DIAGNOSIS — I10 Essential (primary) hypertension: Secondary | ICD-10-CM | POA: Diagnosis not present

## 2018-10-28 DIAGNOSIS — Z79899 Other long term (current) drug therapy: Secondary | ICD-10-CM

## 2018-11-04 ENCOUNTER — Encounter (INDEPENDENT_AMBULATORY_CARE_PROVIDER_SITE_OTHER): Payer: Self-pay | Admitting: Nurse Practitioner

## 2018-11-04 NOTE — Progress Notes (Signed)
SUBJECTIVE:  Patient ID: Rebekah Perry, female    DOB: 04/13/1956, 63 y.o.   MRN: [id number] Chief Complaint  Patient presents with  . Follow-up    ultrasound follow up    HPI  Rebekah BongoFlorence Mijangos is a 63 y.o. female that presents today for follow-up of known peripheral artery arterial disease.  The patient has a known occluded aortobifem graft that was seen on MRI on 06/14/2014.  Despite an occluded aortobifemoral graft the patient denies rest pain or lower extremity ulceration.  The patient states that she does not walk very much so therefore she denies excessive claudication.  However, she does note that sometimes her left lower extremity feels weak.  However, it is not lifestyle limiting for her at this time.  The patient states that she has stopped smoking for approximately a week and hopes to continue with this.  She denies any fever, chills, nausea, vomiting or diarrhea.  She denies any chest pain or shortness of breath.  Today the patient has ABIs of 0.  6 2 on the right lower extremity and 0.74 on the left.  The patient has monophasic waveforms bilaterally.  The right posterior tibial artery at the distal portion shows a near occlusion.  Aortoiliac study shows monophasic flow consistent with aortic bypass graft occlusion.  There is no flow seen within the common iliac arteries or the external iliac arteries except for the distal portions bilaterally.  There are collaterals feeding the distal external iliac arteries bilaterally.  Past Medical History:  Diagnosis Date  . GERD (gastroesophageal reflux disease)   . Hypercholesteremia   . Hypertension     Past Surgical History:  Procedure Laterality Date  . Stent implant  2009   [facility]  Right Iliac     Social History   Socioeconomic History  . Marital status: Single    Spouse name: Not on file  . Number of children: Not on file  . Years of education: Not on file  . Highest education level: Not on file  Occupational History   . Not on file  Social Needs  . Financial resource strain: Not on file  . Food insecurity    Worry: Not on file    Inability: Not on file  . Transportation needs    Medical: Not on file    Non-medical: Not on file  Tobacco Use  . Smoking status: Current Every Day Smoker    Types: Cigarettes  . Smokeless tobacco: Never Used  Substance and Sexual Activity  . Alcohol use: Yes    Comment: Occ  . Drug use: Yes    Types: Cocaine  . Sexual activity: Not Currently    Partners: Male    Birth control/protection: None  Lifestyle  . Physical activity    Days per week: Not on file    Minutes per session: Not on file  . Stress: Not on file  Relationships  . Social Musicianconnections    Talks on phone: Not on file    Gets together: Not on file    Attends religious service: Not on file    Active member of club or organization: Not on file    Attends meetings of clubs or organizations: Not on file    Relationship status: Not on file  . Intimate partner violence    Fear of current or ex partner: Not on file    Emotionally abused: Not on file    Physically abused: Not on file    Forced sexual  activity: Not on file  Other Topics Concern  . Not on file  Social History Narrative  . Not on file    Family History  Problem Relation Age of Onset  . Pancreatic cancer Mother   . Throat cancer Father   . Throat cancer Sister     Allergies  Allergen Reactions  . Sulfa Antibiotics Swelling     Review of Systems   Review of Systems: Negative Unless Checked Constitutional: [] Weight loss  [] Fever  [] Chills Cardiac: [] Chest pain   []  Atrial Fibrillation  [] Palpitations   [] Shortness of breath when laying flat   [] Shortness of breath with exertion. [] Shortness of breath at rest Vascular:  [] Pain in legs with walking   [] Pain in legs with standing [] Pain in legs when laying flat   [] Claudication    [] Pain in feet when laying flat    [] History of DVT   [] Phlebitis   [] Swelling in legs   [] Varicose  veins   [] Non-healing ulcers Pulmonary:   [] Uses home oxygen   [] Productive cough   [] Hemoptysis   [] Wheeze  [] COPD   [] Asthma Neurologic:  [] Dizziness   [] Seizures  [] Blackouts [] History of stroke   [] History of TIA  [] Aphasia   [] Temporary Blindness   [] Weakness or numbness in arm   [x] Weakness or numbness in leg Musculoskeletal:   [] Joint swelling   [] Joint pain   [] Low back pain  []  History of Knee Replacement [] Arthritis [] back Surgeries  []  Spinal Stenosis    Hematologic:  [] Easy bruising  [] Easy bleeding   [] Hypercoagulable state   [] Anemic Gastrointestinal:  [] Diarrhea   [] Vomiting  [x] Gastroesophageal reflux/heartburn   [] Difficulty swallowing. [] Abdominal pain Genitourinary:  [] Chronic kidney disease   [] Difficult urination  [] Anuric   [] Blood in urine [] Frequent urination  [] Burning with urination   [] Hematuria Skin:  [] Rashes   [] Ulcers [] Wounds Psychological:  [] History of anxiety   []  History of major depression  []  Memory Difficulties      OBJECTIVE:   Physical Exam  BP 135/68 (BP Location: Right Arm)   Pulse 74   Resp 16   Ht 5\' 4"  (1.626 m)   Wt 140 lb 6.4 oz (63.7 kg)   BMI 24.10 kg/m   Gen: WD/WN, NAD Head: [place]/AT, No temporalis wasting.  Ear/Nose/Throat: Hearing grossly intact, nares w/o erythema or drainage Eyes: PER, EOMI, sclera nonicteric.  Neck: Supple, no masses.  No JVD.  Pulmonary:  Good air movement, no use of accessory muscles.  Cardiac: RRR Vascular:  Vessel Right Left  Radial Palpable Palpable  Dorsalis Pedis Not Palpable Not Palpable  Posterior Tibial Not Palpable Not Palpable   Gastrointestinal: soft, non-distended. No guarding/no peritoneal signs.  Musculoskeletal: M/S 5/5 throughout.  No deformity or atrophy.  Neurologic: Pain and light touch intact in extremities.  Symmetrical.  Speech is fluent. Motor exam as listed above. Psychiatric: Judgment intact, Mood & affect appropriate for pt's clinical situation. Dermatologic: No Venous rashes. No  Ulcers Noted.  No changes consistent with cellulitis. Lymph : No Cervical lymphadenopathy, no lichenification or skin changes of chronic lymphedema.       ASSESSMENT AND PLAN:  1. Peripheral vascular disease ([place]) At this time the patient's symptoms are not lifestyle limiting nor does she have symptoms that would indicate the need to intervene immediately.  Due to the fact that the patient has an occluded aortobifem, repair would be surgical in nature.  In this instance the best option would likely be an axillary to femoral bypass with femorofemoral  bypass.  At this time the patient does not wish to pursue surgical options and she would like to be conservative as possible.  Advised the patient of signs and symptoms that would be cause for immediate concern such as development of rest pain, ulcerations, loss of motor function or continued weakness or numbness of her lower extremities.  Again, smoking was stressed to try to allow the patient more time without surgical intervention.  The patient will follow-up in 6 months with invasive studies. - VAS US ABI WITH/WO TBI; Future - VAS US AORTA/IVC/ILIACS; Future - VAS US LOWER EXTREMITY ARTERIAL DUPLEX; Future  2. Essential hypertension Continue antihypertensive medications as already ordered, these medications have been reviewed and there are no changes at this time.   3. Mixed hyperlipidemia Continue statin as ordered and reviewed, no changes at this time   4. Tobacco abuse Advised patient to continue to abstain from tobacco.  Encourage further abstinence.  Current Outpatient Medications on File Prior to Visit  Medication Sig Dispense Refill  . amLODipine (NORVASC) 10 MG tablet TAKE 1 TABLET BY MOUTH DAILY FOR HIGH BLOOD PRESSURE TO REPLACE 5 MG    . aspirin EC 81 MG tablet Take 81 mg by mouth daily.    Marland Kitchen. atorvastatin (LIPITOR) 20 MG tablet Take by mouth.    . fluticasone (FLONASE) 50 MCG/ACT nasal spray SPRAY 1 TO 2 SPRAYS IN EACH  NOSTRIL AT BEDTIME    . losartan (COZAAR) 50 MG tablet Take by mouth.    . nitroGLYCERIN (NITROSTAT) 0.4 MG SL tablet PLACE 1 TAB UNDER TONGUE EVERY 5 MIN AS NEEDED FOR CHEST PAIN-IF USE 3 TABS OR PAIN PERSISTS-GO TO E    . omeprazole (PRILOSEC) 20 MG capsule Take by mouth.    . oxybutynin (DITROPAN) 5 MG tablet Take 5 mg by mouth 3 (three) times daily.    Marland Kitchen. albuterol (PROVENTIL HFA;VENTOLIN HFA) 108 (90 Base) MCG/ACT inhaler Inhale into the lungs.    Marland Kitchen. amlodipine-atorvastatin (CADUET) 10-10 MG tablet Take 1 tablet by mouth daily.    . bisacodyl (DULCOLAX) 5 MG EC tablet Take by mouth.     No current facility-administered medications on file prior to visit.     There are no Patient Instructions on file for this visit. Return in about 6 months (around 04/30/2019) for PAD.   Georgiana SpinnerFallon E Keval Nam, NP  This note was completed with Office managerDragon Dictation.  Any errors are purely unintentional.

## 2019-01-03 ENCOUNTER — Encounter: Payer: Self-pay | Admitting: Family Medicine

## 2019-01-04 ENCOUNTER — Other Ambulatory Visit: Payer: Self-pay

## 2019-01-04 DIAGNOSIS — Z1211 Encounter for screening for malignant neoplasm of colon: Secondary | ICD-10-CM

## 2019-01-04 MED ORDER — NA SULFATE-K SULFATE-MG SULF 17.5-3.13-1.6 GM/177ML PO SOLN: 1.0000 | Freq: Once | ORAL | 0 refills | Status: AC

## 2019-01-14 ENCOUNTER — Other Ambulatory Visit
Admission: RE | Admit: 2019-01-14 | Discharge: 2019-01-14 | Disposition: A | Discharge status: Home / Self Care | Payer: Medicare HMO | Source: Ambulatory Visit | Attending: Gastroenterology | Admitting: Gastroenterology

## 2019-01-14 DIAGNOSIS — Z20828 Contact with and (suspected) exposure to other viral communicable diseases: Secondary | ICD-10-CM | POA: Diagnosis not present

## 2019-01-14 DIAGNOSIS — Z01812 Encounter for preprocedural laboratory examination: Secondary | ICD-10-CM | POA: Insufficient documentation

## 2019-01-14 LAB — SARS CORONAVIRUS 2 (TAT 6-24 HRS): SARS Coronavirus 2: NEGATIVE

## 2019-01-18 ENCOUNTER — Ambulatory Visit
Admission: RE | Admit: 2019-01-18 | Discharge: 2019-01-18 | Disposition: A | Discharge status: Home / Self Care | Payer: Medicare HMO | Attending: Gastroenterology | Admitting: Gastroenterology

## 2019-01-18 ENCOUNTER — Ambulatory Visit: Payer: Medicare HMO | Admitting: Anesthesiology

## 2019-01-18 ENCOUNTER — Encounter
Admission: RE | Disposition: A | Discharge status: Home / Self Care | Payer: Self-pay | Source: Home / Self Care | Attending: Gastroenterology

## 2019-01-18 ENCOUNTER — Encounter: Payer: Self-pay | Admitting: *Deleted

## 2019-01-18 DIAGNOSIS — I739 Peripheral vascular disease, unspecified: Secondary | ICD-10-CM | POA: Insufficient documentation

## 2019-01-18 DIAGNOSIS — Z79899 Other long term (current) drug therapy: Secondary | ICD-10-CM | POA: Diagnosis not present

## 2019-01-18 DIAGNOSIS — Z7982 Long term (current) use of aspirin: Secondary | ICD-10-CM | POA: Diagnosis not present

## 2019-01-18 DIAGNOSIS — K219 Gastro-esophageal reflux disease without esophagitis: Secondary | ICD-10-CM | POA: Insufficient documentation

## 2019-01-18 DIAGNOSIS — F17210 Nicotine dependence, cigarettes, uncomplicated: Secondary | ICD-10-CM | POA: Diagnosis not present

## 2019-01-18 DIAGNOSIS — Z1211 Encounter for screening for malignant neoplasm of colon: Secondary | ICD-10-CM | POA: Diagnosis not present

## 2019-01-18 DIAGNOSIS — E7800 Pure hypercholesterolemia, unspecified: Secondary | ICD-10-CM | POA: Diagnosis not present

## 2019-01-18 DIAGNOSIS — I10 Essential (primary) hypertension: Secondary | ICD-10-CM | POA: Diagnosis not present

## 2019-01-18 HISTORY — PX: COLONOSCOPY WITH PROPOFOL: SHX5780

## 2019-01-18 SURGERY — COLONOSCOPY WITH PROPOFOL
Anesthesia: General

## 2019-01-18 MED ORDER — SODIUM CHLORIDE 0.9 % IV SOLN
INTRAVENOUS | Status: DC
  Administered 2019-01-18 (×2): via INTRAVENOUS

## 2019-01-18 MED ORDER — GLYCOPYRROLATE 0.2 MG/ML IJ SOLN
INTRAMUSCULAR | Status: DC | PRN
  Administered 2019-01-18: 0.2 mg via INTRAVENOUS

## 2019-01-18 MED ORDER — LIDOCAINE HCL (CARDIAC) PF 100 MG/5ML IV SOSY
PREFILLED_SYRINGE | INTRAVENOUS | Status: DC | PRN
  Administered 2019-01-18: 100 mg via INTRATRACHEAL

## 2019-01-18 MED ORDER — PROPOFOL 500 MG/50ML IV EMUL
INTRAVENOUS | Status: DC | PRN
  Administered 2019-01-18: 140 ug/kg/min via INTRAVENOUS

## 2019-01-18 MED ORDER — PROPOFOL 10 MG/ML IV BOLUS
INTRAVENOUS | Status: DC | PRN
  Administered 2019-01-18: 50 mg via INTRAVENOUS

## 2019-01-18 NOTE — Op Note (Signed)
[facility] Gastroenterology Patient Name: Rebekah Perry Procedure Date: 01/18/2019 9:16 AM MRN: [id number] Account #: [id number] Date of Birth: [birth year] Admit Type: Outpatient Age: 63 Room: [facility] ENDO ROOM 1 Gender: Female Note Status: Finalized Procedure:            Colonoscopy Indications:          Screening for colorectal malignant neoplasm Providers:            [name] MD, MD Referring MD:         Oswaldo Done. Hessie Diener MD, MD (Referring MD) Medicines:            Monitored Anesthesia Care Complications:        No immediate complications. Procedure:            Pre-Anesthesia Assessment:                       - Prior to the procedure, a History and Physical was                        performed, and patient medications, allergies and                        sensitivities were reviewed. The patient's tolerance of                        previous anesthesia was reviewed.                       - The risks and benefits of the procedure and the                        sedation options and risks were discussed with the                        patient. All questions were answered and informed                        consent was obtained.                       - ASA Grade Assessment: II - A patient with mild                        systemic disease.                       After obtaining informed consent, the colonoscope was                        passed under direct vision. Throughout the procedure,                        the patient's blood pressure, pulse, and oxygen                        saturations were monitored continuously. The                        Colonoscope was introduced through the anus and  advanced to the the cecum, identified by the                        appendiceal orifice. The colonoscopy was performed with                        ease. The patient tolerated the procedure well. The                        quality of the bowel preparation  was excellent. Findings:      The perianal and digital rectal examinations were normal.      The entire examined colon appeared normal on direct and retroflexion       views. Impression:           - The entire examined colon is normal on direct and                        retroflexion views.                       - No specimens collected. Recommendation:       - Discharge patient to home (with escort).                       - Resume previous diet.                       - Continue present medications.                       - Repeat colonoscopy in 10 years for screening purposes. Procedure Code(s):    --- Professional ---                       [phone], Colonoscopy, flexible; diagnostic, including                        collection of specimen(s) by brushing or washing, when                        performed (separate procedure) Diagnosis Code(s):    --- Professional ---                       Z12.11, Encounter for screening for malignant neoplasm                        of colon CPT copyright 2019 American Medical Association. All rights reserved. The codes documented in this report are preliminary and upon coder review may  be revised to meet current compliance requirements. Jonathon Bellows, MD Jonathon Bellows MD, MD 01/18/2019 9:35:50 AM This report has been signed electronically. Number of Addenda: 0 Note Initiated On: 01/18/2019 9:16 AM Scope Withdrawal Time: 0 hours 6 minutes 41 seconds  Total Procedure Duration: 0 hours 8 minutes 20 seconds  Estimated Blood Loss: Estimated blood loss: none.      [facility]

## 2019-01-18 NOTE — Transfer of Care (Signed)
Immediate Anesthesia Transfer of Care Note  Patient: Rebekah Perry  Procedure(s) Performed: COLONOSCOPY WITH PROPOFOL (N/A )  Patient Location: Endoscopy Unit  Anesthesia Type:General  Level of Consciousness: awake, alert , oriented and patient cooperative  Airway & Oxygen Therapy: Patient Spontanous Breathing and Patient connected to face mask oxygen  Post-op Assessment: Report given to RN and Post -op Vital signs reviewed and stable  Post vital signs: Reviewed and stable  Last Vitals:  Vitals Value Taken Time  BP 119/70 01/18/19 0939  Temp 36.4 C 01/18/19 0938  Pulse 104 01/18/19 0943  Resp 17 01/18/19 0943  SpO2 100 % 01/18/19 0943  Vitals shown include unvalidated device data.  Last Pain:  Vitals:   01/18/19 0938  TempSrc: Tympanic  PainSc: 0-No pain         Complications: No apparent anesthesia complications

## 2019-01-18 NOTE — H&P (Signed)
[name], MD [address], [address], [place], [place], [address] [address], [place], [place], [address] Phone: [phone]  Fax: [phone]  Primary Care Physician:  Oswaldo Conroy, MD   Pre-Procedure History & Physical: HPI:  Rebekah Perry is a 63 y.o. female is here for an colonoscopy.   Past Medical History:  Diagnosis Date  . GERD (gastroesophageal reflux disease)   . Hypercholesteremia   . Hypertension     Past Surgical History:  Procedure Laterality Date  . Stent implant  2009   [facility]  Right Iliac     Prior to Admission medications   Medication Sig Start Date End Date Taking? Authorizing Provider  amLODipine (NORVASC) 10 MG tablet TAKE 1 TABLET BY MOUTH DAILY FOR HIGH BLOOD PRESSURE TO REPLACE 5 MG 09/13/18  Yes [provider]  aspirin EC 325 MG tablet Take 325 mg by mouth daily.   Yes [provider]  fluticasone (FLONASE) 50 MCG/ACT nasal spray SPRAY 1 TO 2 SPRAYS IN EACH NOSTRIL AT BEDTIME 05/28/16  Yes [provider]  hydrochlorothiazide (MICROZIDE) 12.5 MG capsule Take 12.5 mg by mouth daily.   Yes [provider]  losartan (COZAAR) 50 MG tablet Take 100 mg by mouth.  01/10/16  Yes [provider]  omeprazole (PRILOSEC) 20 MG capsule Take by mouth. 12/19/13  Yes [provider]  oxybutynin (DITROPAN) 5 MG tablet Take 5 mg by mouth 3 (three) times daily.   Yes [provider]  albuterol (PROVENTIL HFA;VENTOLIN HFA) 108 (90 Base) MCG/ACT inhaler Inhale into the lungs. 09/30/17 09/30/18  [provider]  amlodipine-atorvastatin (CADUET) 10-10 MG tablet Take 1 tablet by mouth daily.    [provider]  aspirin EC 81 MG tablet Take 81 mg by mouth daily.    [provider]  atorvastatin (LIPITOR) 20 MG tablet Take by mouth. 05/04/15   [provider]  bisacodyl (DULCOLAX) 5 MG EC tablet Take by mouth.    [provider]   nitroGLYCERIN (NITROSTAT) 0.4 MG SL tablet PLACE 1 TAB UNDER TONGUE EVERY 5 MIN AS NEEDED FOR CHEST PAIN-IF USE 3 TABS OR PAIN PERSISTS-GO TO E 10/31/15   [provider]    Allergies as of 01/04/2019 - Review Complete 11/04/2018  Allergen Reaction Noted  . Sulfa antibiotics Swelling 08/01/2013    Family History  Problem Relation Age of Onset  . Pancreatic cancer Mother   . Throat cancer Father   . Throat cancer Sister     Social History   Socioeconomic History  . Marital status: Single    Spouse name: Not on file  . Number of children: Not on file  . Years of education: Not on file  . Highest education level: Not on file  Occupational History  . Not on file  Social Needs  . Financial resource strain: Not on file  . Food insecurity    Worry: Not on file    Inability: Not on file  . Transportation needs    Medical: Not on file    Non-medical: Not on file  Tobacco Use  . Smoking status: Current Every Day Smoker    Types: Cigarettes  . Smokeless tobacco: Never Used  Substance and Sexual Activity  . Alcohol use: Yes    Comment: Occ  . Drug use: Not Currently  . Sexual activity: Not Currently    Partners: Male    Birth control/protection: None  Lifestyle  . Physical activity    Days per week: Not on file    Minutes per session: Not on file  . Stress: Not on file  Relationships  . Social Herbalist on phone: Not on file    Gets together: Not on file    Attends religious service: Not on file    Active member of club or organization: Not on file    Attends meetings of clubs or organizations: Not on file    Relationship status: Not on file  . Intimate partner violence    Fear of current or ex partner: Not on file    Emotionally abused: Not on file    Physically abused: Not on file    Forced sexual activity: Not on file  Other Topics Concern  . Not on file  Social History Narrative  . Not on file    Review of Systems: See HPI, otherwise  negative ROS  Physical Exam: BP 140/75   Pulse 86   Temp (!) 96.8 F (36 C) (Tympanic)   Resp 17   Ht 5\' 4"  (1.626 m)   Wt 77.1 kg   SpO2 93%   BMI 29.18 kg/m  General:   Alert,  pleasant and cooperative in NAD Head:  Normocephalic and atraumatic. Neck:  Supple; no masses or thyromegaly. Lungs:  Clear throughout to auscultation, normal respiratory effort.    Heart:  +S1, +S2, Regular rate and rhythm, No edema. Abdomen:  Soft, nontender and nondistended. Normal bowel sounds, without guarding, and without rebound.   Neurologic:  Alert and  oriented x4;  grossly normal neurologically.  Impression/Plan: [name] is here for an colonoscopy to be performed for Screening colonoscopy average risk   Risks, benefits, limitations, and alternatives regarding  colonoscopy have been reviewed with the patient.  Questions have been answered.  All parties agreeable.   Jonathon Bellows, MD  01/18/2019, 9:12 AM

## 2019-01-18 NOTE — Anesthesia Post-op Follow-up Note (Signed)
Anesthesia QCDR form completed.        

## 2019-01-18 NOTE — Anesthesia Preprocedure Evaluation (Addendum)
Anesthesia Evaluation  Patient identified by MRN, date of birth, ID band Patient awake    Reviewed: Allergy & Precautions, H&P , NPO status , Patient's Chart, lab work & pertinent test results, reviewed documented beta blocker date and time   Airway Mallampati: II   Neck ROM: full    Dental  (+) Poor Dentition   Pulmonary neg pulmonary ROS, Current Smoker and Patient abstained from smoking.,    Pulmonary exam normal        Cardiovascular Exercise Tolerance: Poor hypertension, + Peripheral Vascular Disease  Normal cardiovascular exam Rhythm:regular Rate:Normal     Neuro/Psych negative neurological ROS  negative psych ROS   GI/Hepatic Neg liver ROS, GERD  Medicated,  Endo/Other  negative endocrine ROS  Renal/GU negative Renal ROS  negative genitourinary   Musculoskeletal   Abdominal   Peds  Hematology negative hematology ROS (+)   Anesthesia Other Findings Past Medical History: No date: GERD (gastroesophageal reflux disease) No date: Hypercholesteremia No date: Hypertension Past Surgical History: 2009: Stent implant     Comment:  [facility]  Right Iliac    Reproductive/Obstetrics negative OB ROS                            Anesthesia Physical Anesthesia Plan  ASA: III  Anesthesia Plan: General   Post-op Pain Management:    Induction:   PONV Risk Score and Plan:   Airway Management Planned:   Additional Equipment:   Intra-op Plan:   Post-operative Plan:   Informed Consent: I have reviewed the patients History and Physical, chart, labs and discussed the procedure including the risks, benefits and alternatives for the proposed anesthesia with the patient or authorized representative who has indicated his/her understanding and acceptance.     Dental Advisory Given  Plan Discussed with: CRNA  Anesthesia Plan Comments:         Anesthesia Quick Evaluation

## 2019-01-21 NOTE — Anesthesia Postprocedure Evaluation (Signed)
Anesthesia Post Note  Patient: Iran Planas  Procedure(s) Performed: COLONOSCOPY WITH PROPOFOL (N/A )  Patient location during evaluation: PACU Anesthesia Type: General Level of consciousness: awake and alert Pain management: pain level controlled Vital Signs Assessment: post-procedure vital signs reviewed and stable Respiratory status: spontaneous breathing, nonlabored ventilation, respiratory function stable and patient connected to nasal cannula oxygen Cardiovascular status: blood pressure returned to baseline and stable Postop Assessment: no apparent nausea or vomiting Anesthetic complications: no     Last Vitals:  Vitals:   01/18/19 0948 01/18/19 0958  BP: (!) 142/71 139/70  Pulse:    Resp:    Temp:    SpO2:      Last Pain:  Vitals:   01/18/19 0958  TempSrc:   PainSc: 0-No pain                 Molli Barrows

## 2019-03-21 ENCOUNTER — Other Ambulatory Visit: Payer: Self-pay

## 2019-03-21 DIAGNOSIS — Z20822 Contact with and (suspected) exposure to covid-19: Secondary | ICD-10-CM

## 2019-03-23 LAB — NOVEL CORONAVIRUS, NAA: SARS-CoV-2, NAA: NOT DETECTED

## 2019-05-02 ENCOUNTER — Ambulatory Visit (INDEPENDENT_AMBULATORY_CARE_PROVIDER_SITE_OTHER): Payer: Medicare Other | Admitting: Vascular Surgery

## 2019-05-02 ENCOUNTER — Encounter (INDEPENDENT_AMBULATORY_CARE_PROVIDER_SITE_OTHER): Payer: Medicare Other

## 2019-08-26 ENCOUNTER — Telehealth (INDEPENDENT_AMBULATORY_CARE_PROVIDER_SITE_OTHER): Payer: Self-pay | Admitting: Vascular Surgery

## 2019-08-26 NOTE — Telephone Encounter (Signed)
ABIs and a left LE DVT study. Me or schnier

## 2019-08-26 NOTE — Telephone Encounter (Signed)
Patient has been having left leg pain for 2 weeks and has an knot located on the side and behind the knee

## 2019-08-26 NOTE — Telephone Encounter (Signed)
Called patient back to schedule and no answer and also no vm to lvm. Will try calling back later to schedule

## 2020-01-12 ENCOUNTER — Other Ambulatory Visit (INDEPENDENT_AMBULATORY_CARE_PROVIDER_SITE_OTHER): Payer: Self-pay | Admitting: Vascular Surgery

## 2020-01-12 DIAGNOSIS — Z95828 Presence of other vascular implants and grafts: Secondary | ICD-10-CM

## 2020-01-12 DIAGNOSIS — Z9889 Other specified postprocedural states: Secondary | ICD-10-CM

## 2020-01-19 ENCOUNTER — Ambulatory Visit (INDEPENDENT_AMBULATORY_CARE_PROVIDER_SITE_OTHER): Payer: Medicare Other

## 2020-01-19 ENCOUNTER — Other Ambulatory Visit: Payer: Self-pay

## 2020-01-19 ENCOUNTER — Ambulatory Visit (INDEPENDENT_AMBULATORY_CARE_PROVIDER_SITE_OTHER): Payer: Medicare Other | Admitting: Vascular Surgery

## 2020-01-19 ENCOUNTER — Encounter (INDEPENDENT_AMBULATORY_CARE_PROVIDER_SITE_OTHER): Payer: Self-pay | Admitting: Vascular Surgery

## 2020-01-19 VITALS — BP 133/76 | HR 87 | Resp 16 | Wt 140.0 lb

## 2020-01-19 DIAGNOSIS — Z9889 Other specified postprocedural states: Secondary | ICD-10-CM

## 2020-01-19 DIAGNOSIS — I10 Essential (primary) hypertension: Secondary | ICD-10-CM | POA: Diagnosis not present

## 2020-01-19 DIAGNOSIS — Z95828 Presence of other vascular implants and grafts: Secondary | ICD-10-CM

## 2020-01-19 DIAGNOSIS — I70223 Atherosclerosis of native arteries of extremities with rest pain, bilateral legs: Secondary | ICD-10-CM | POA: Diagnosis not present

## 2020-01-19 DIAGNOSIS — I70229 Atherosclerosis of native arteries of extremities with rest pain, unspecified extremity: Secondary | ICD-10-CM | POA: Insufficient documentation

## 2020-01-19 DIAGNOSIS — E782 Mixed hyperlipidemia: Secondary | ICD-10-CM

## 2020-01-19 DIAGNOSIS — I739 Peripheral vascular disease, unspecified: Secondary | ICD-10-CM | POA: Diagnosis not present

## 2020-01-19 NOTE — Progress Notes (Signed)
MRN : [id number]  Rebekah Perry is a 64 y.o. (June 23, 1955) female who presents with chief complaint of No chief complaint on file. Marland Kitchen  History of Present Illness:   presents today for follow-up of known peripheral artery arterial disease.  The patient has a known occluded aortobifem graft that was seen on MRI on 06/14/2014.  Despite an occluded aortobifemoral graft the patient denies rest pain or lower extremity ulceration.  The patient states that she does not walk very much so therefore she denies excessive claudication.  However, she does note that sometimes her left lower extremity feels weak.  However, it is not lifestyle limiting for her at this time.  The patient states that she has stopped smoking for approximately a week and hopes to continue with this.  She denies any fever, chills, nausea, vomiting or diarrhea.  She denies any chest pain or shortness of breath.  Today the patient has ABIs of 0.35 on the right lower extremity and 0.61 on the left.  The patient has monophasic waveforms bilaterally with flat toe tracings.  This is a deterioration from the study one year ago.  The right posterior tibial artery at the distal portion shows a near occlusion.  Aortoiliac study shows monophasic flow consistent with aortic bypass graft occlusion.  There is no flow seen within the common iliac arteries or the external iliac arteries except for the distal portions bilaterally.  There are collaterals feeding the distal external iliac arteries bilaterally.  No outpatient medications have been marked as taking for the 01/19/20 encounter (Appointment) with Gilda Crease, Latina Craver, MD.    Past Medical History:  Diagnosis Date  . GERD (gastroesophageal reflux disease)   . Hypercholesteremia   . Hypertension     Past Surgical History:  Procedure Laterality Date  . COLONOSCOPY WITH PROPOFOL N/A 01/18/2019   Procedure: COLONOSCOPY WITH PROPOFOL;  Surgeon: [name], MD;  Location: [facility] ENDOSCOPY;  Service:  Gastroenterology;  Laterality: N/A;  . Stent implant  2009   [facility]  Right Iliac     Social History Social History   Tobacco Use  . Smoking status: Current Every Day Smoker    Types: Cigarettes  . Smokeless tobacco: Never Used  Vaping Use  . Vaping Use: Never assessed  Substance Use Topics  . Alcohol use: Yes    Comment: Occ  . Drug use: Not Currently    Family History Family History  Problem Relation Age of Onset  . Pancreatic cancer Mother   . Throat cancer Father   . Throat cancer Sister     Allergies  Allergen Reactions  . Sulfa Antibiotics Swelling     REVIEW OF SYSTEMS (Negative unless checked)  Constitutional: [] Weight loss  [] Fever  [] Chills Cardiac: [] Chest pain   [] Chest pressure   [] Palpitations   [] Shortness of breath when laying flat   [] Shortness of breath with exertion. Vascular:  [x] Pain in legs with walking   [x] Pain in legs at rest  [] History of DVT   [] Phlebitis   [] Swelling in legs   [] Varicose veins   [] Non-healing ulcers Pulmonary:   [] Uses home oxygen   [] Productive cough   [] Hemoptysis   [] Wheeze  [] COPD   [] Asthma Neurologic:  [] Dizziness   [] Seizures   [] History of stroke   [] History of TIA  [] Aphasia   [] Vissual changes   [] Weakness or numbness in arm   [] Weakness or numbness in leg Musculoskeletal:   [] Joint swelling   [] Joint pain   [] Low back pain  Hematologic:  [] Easy bruising  [] Easy bleeding   [] Hypercoagulable state   [] Anemic Gastrointestinal:  [] Diarrhea   [] Vomiting  [] Gastroesophageal reflux/heartburn   [] Difficulty swallowing. Genitourinary:  [] Chronic kidney disease   [] Difficult urination  [] Frequent urination   [] Blood in urine Skin:  [] Rashes   [] Ulcers  Psychological:  [] History of anxiety   []  History of major depression.  Physical Examination  There were no vitals filed for this visit. There is no height or weight on file to calculate BMI. Gen: WD/WN, NAD Head: [place]/AT, No temporalis wasting.  Ear/Nose/Throat:  Hearing grossly intact, nares w/o erythema or drainage Eyes: PER, EOMI, sclera nonicteric.  Neck: Supple, no large masses.   Pulmonary:  Good air movement, no audible wheezing bilaterally, no use of accessory muscles.  Cardiac: RRR, no JVD Vascular:  Vessel Right Left  Radial Palpable Palpable  PT Not Palpable Not Palpable  DP Not Palpable Not Palpable  Gastrointestinal: Non-distended. No guarding/no peritoneal signs.  Musculoskeletal: M/S 5/5 throughout.  No deformity or atrophy.  Neurologic: CN 2-12 intact. Symmetrical.  Speech is fluent. Motor exam as listed above. Psychiatric: Judgment intact, Mood & affect appropriate for pt's clinical situation. Dermatologic: No rashes or ulcers noted.  No changes consistent with cellulitis.   CBC Lab Results  Component Value Date   WBC 8.4 04/13/2018   HGB 12.9 04/13/2018   HCT 39.2 04/13/2018   MCV 84 04/13/2018   PLT 318 04/13/2018    BMET    Component Value Date/Time   NA 144 04/13/2018 0905   K 3.9 04/13/2018 0905   CL 104 04/13/2018 0905   CO2 24 04/13/2018 0905   GLUCOSE 97 04/13/2018 0905   BUN 8 04/13/2018 0905   CREATININE 0.74 04/13/2018 0905   CALCIUM 9.7 04/13/2018 0905   GFRNONAA 87 04/13/2018 0905   GFRAA 100 04/13/2018 0905   CrCl cannot be calculated (Patient's most recent lab result is older than the maximum 21 days allowed.).  COAG No results found for: INR, PROTIME  Radiology No results found.   Assessment/Plan 1. Atherosclerosis of native artery of both lower extremities with rest pain (HCC) Recommend:  The patient has evidence of severe atherosclerotic changes of both lower extremities with rest pain that is associated with preulcerative changes and impending tissue loss of the foot.  This represents a limb threatening ischemia and places the patient at the risk for limb loss.  Patient should undergo CT angiography of the lower extremities with the hope for planning intervention for limb salvage.   The risks and benefits as well as the alternative therapies was discussed in detail with the patient.  All questions were answered.  Patient agrees to proceed with CT angiography.  CT angiogram is being obtained given the previous aorta-bifem and the known thrombosis.  The patient will follow up with me in the office after the procedure.    - CT ANGIO AO+BIFEM W & OR WO CONTRAST; Future  2. Primary hypertension Continue antihypertensive medications as already ordered, these medications have been reviewed and there are no changes at this time.   3. Mixed hyperlipidemia Continue statin as ordered and reviewed, no changes at this time    , MD  01/19/2020 11:33 AM

## 2020-01-25 ENCOUNTER — Other Ambulatory Visit (INDEPENDENT_AMBULATORY_CARE_PROVIDER_SITE_OTHER): Payer: Self-pay | Admitting: Nurse Practitioner

## 2020-01-25 ENCOUNTER — Telehealth (INDEPENDENT_AMBULATORY_CARE_PROVIDER_SITE_OTHER): Payer: Self-pay

## 2020-01-25 DIAGNOSIS — Z01812 Encounter for preprocedural laboratory examination: Secondary | ICD-10-CM

## 2020-01-25 NOTE — Telephone Encounter (Signed)
It is in.

## 2020-01-25 NOTE — Telephone Encounter (Signed)
[facility] imaging CT Dept called and said that the pt is scheduled to have a CT angio tomorrow and she needs to have a creatinine lab .The  CT dept would like our office to put in an order for the needed lab. Please advise.

## 2020-01-26 ENCOUNTER — Other Ambulatory Visit
Admission: RE | Admit: 2020-01-26 | Discharge: 2020-01-26 | Disposition: A | Discharge status: Home / Self Care | Payer: Medicare Other | Source: Ambulatory Visit | Attending: Nurse Practitioner | Admitting: Nurse Practitioner

## 2020-01-26 ENCOUNTER — Ambulatory Visit
Admission: RE | Admit: 2020-01-26 | Discharge: 2020-01-26 | Disposition: A | Discharge status: Home / Self Care | Payer: Medicare Other | Source: Ambulatory Visit | Attending: Vascular Surgery | Admitting: Vascular Surgery

## 2020-01-26 ENCOUNTER — Other Ambulatory Visit: Payer: Self-pay

## 2020-01-26 DIAGNOSIS — I70223 Atherosclerosis of native arteries of extremities with rest pain, bilateral legs: Secondary | ICD-10-CM

## 2020-01-26 DIAGNOSIS — Z01812 Encounter for preprocedural laboratory examination: Secondary | ICD-10-CM | POA: Insufficient documentation

## 2020-01-26 LAB — CREATININE, SERUM
Creatinine, Ser: 0.81 mg/dL (ref 0.44–1.00)
GFR, Estimated: >60 mL/min (ref >60)

## 2020-01-26 MED ORDER — IOHEXOL 350 MG/ML SOLN: 75.0000 mL | Freq: Once | INTRAVENOUS | Status: DC | PRN

## 2020-01-26 MED ORDER — IOHEXOL 350 MG/ML SOLN
150.0000 mL | Freq: Once | INTRAVENOUS | Status: AC | PRN
  Administered 2020-01-26: 125 mL via INTRAVENOUS

## 2020-01-26 MED ORDER — IOHEXOL 300 MG/ML  SOLN: 125.0000 mL | Freq: Once | INTRAMUSCULAR | Status: DC | PRN

## 2020-01-26 NOTE — Telephone Encounter (Signed)
I called and left a VM making the Radiology Dept aware that the lab order has been put in.

## 2020-01-30 ENCOUNTER — Encounter (INDEPENDENT_AMBULATORY_CARE_PROVIDER_SITE_OTHER): Payer: Self-pay | Admitting: Vascular Surgery

## 2020-01-30 ENCOUNTER — Other Ambulatory Visit: Payer: Self-pay

## 2020-01-30 ENCOUNTER — Ambulatory Visit (INDEPENDENT_AMBULATORY_CARE_PROVIDER_SITE_OTHER): Payer: Medicare Other | Admitting: Vascular Surgery

## 2020-01-30 VITALS — BP 145/76 | HR 94 | Resp 16 | Wt 141.4 lb

## 2020-01-30 DIAGNOSIS — I10 Essential (primary) hypertension: Secondary | ICD-10-CM | POA: Diagnosis not present

## 2020-01-30 DIAGNOSIS — I70213 Atherosclerosis of native arteries of extremities with intermittent claudication, bilateral legs: Secondary | ICD-10-CM

## 2020-01-30 DIAGNOSIS — I771 Stricture of artery: Secondary | ICD-10-CM

## 2020-01-30 DIAGNOSIS — E782 Mixed hyperlipidemia: Secondary | ICD-10-CM

## 2020-01-30 DIAGNOSIS — I774 Celiac artery compression syndrome: Secondary | ICD-10-CM

## 2020-01-30 DIAGNOSIS — K219 Gastro-esophageal reflux disease without esophagitis: Secondary | ICD-10-CM

## 2020-02-01 ENCOUNTER — Encounter (INDEPENDENT_AMBULATORY_CARE_PROVIDER_SITE_OTHER): Payer: Self-pay | Admitting: Vascular Surgery

## 2020-02-01 DIAGNOSIS — I774 Celiac artery compression syndrome: Secondary | ICD-10-CM | POA: Insufficient documentation

## 2020-02-01 DIAGNOSIS — I70219 Atherosclerosis of native arteries of extremities with intermittent claudication, unspecified extremity: Secondary | ICD-10-CM | POA: Insufficient documentation

## 2020-02-01 DIAGNOSIS — I771 Stricture of artery: Secondary | ICD-10-CM | POA: Insufficient documentation

## 2020-02-01 NOTE — Progress Notes (Signed)
MRN : [id number]  Rebekah Perry is a 64 y.o. (1955/09/27) female who presents with chief complaint of  Chief Complaint  Patient presents with  . Follow-up    ct results  .  History of Present Illness:   The patient returns to the office for followup and review of the noninvasive studies. There have been no interval changes in lower extremity symptoms, she continues to have short distance claudication which she admits impedes with her daily activities. No interval shortening of the patient's claudication distance or worsening of rest pain symptoms. No new ulcers or wounds have occurred since the last visit.  Patient denies postprandial abdominal pain or weight loss  There have been no significant changes to the patient's overall health care.  The patient denies amaurosis fugax or recent TIA symptoms. There are no recent neurological changes noted. The patient denies history of DVT, PE or superficial thrombophlebitis. The patient denies recent episodes of angina or shortness of breath.   CT angiogram is reviewed by me with the patient and her daughter.  There are 4 salient points.  #1 previously placed stents within the iliac system are occluded.  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  #4 the bilateral common femoral arteries and the distal runoff is preserved and there is minimal disease noted on the CT scan from the level of the femorals down to the feet.   Current Meds  Medication Sig  . amlodipine-atorvastatin (CADUET) 10-10 MG tablet Take 1 tablet by mouth daily.  Marland Kitchen aspirin EC 325 MG tablet Take 325 mg by mouth daily.  . fluticasone (FLONASE) 50 MCG/ACT nasal spray SPRAY 1 TO 2 SPRAYS IN EACH NOSTRIL AT BEDTIME  . hydrochlorothiazide (MICROZIDE) 12.5 MG capsule Take 12.5 mg by mouth daily.  Marland Kitchen losartan (COZAAR) 50 MG tablet Take 100 mg by mouth.   . nitroGLYCERIN (NITROSTAT) 0.4 MG SL tablet PLACE 1 TAB UNDER  TONGUE EVERY 5 MIN AS NEEDED FOR CHEST PAIN-IF USE 3 TABS OR PAIN PERSISTS-GO TO E  . omeprazole (PRILOSEC) 20 MG capsule Take by mouth.  . oxybutynin (DITROPAN) 5 MG tablet Take 5 mg by mouth 3 (three) times daily.    Past Medical History:  Diagnosis Date  . GERD (gastroesophageal reflux disease)   . Hypercholesteremia   . Hypertension     Past Surgical History:  Procedure Laterality Date  . COLONOSCOPY WITH PROPOFOL N/A 01/18/2019   Procedure: COLONOSCOPY WITH PROPOFOL;  Surgeon: [name], MD;  Location: [facility] ENDOSCOPY;  Service: Gastroenterology;  Laterality: N/A;  . Stent implant  2009   [facility]  Right Iliac     Social History Social History   Tobacco Use  . Smoking status: Current Every Day Smoker    Types: Cigarettes  . Smokeless tobacco: Never Used  Vaping Use  . Vaping Use: Never assessed  Substance Use Topics  . Alcohol use: Yes    Comment: Occ  . Drug use: Not Currently    Family History Family History  Problem Relation Age of Onset  . Pancreatic cancer Mother   . Throat cancer Father   . Throat cancer Sister     Allergies  Allergen Reactions  . Sulfa Antibiotics Swelling     REVIEW OF SYSTEMS (Negative unless checked)  Constitutional: [] Weight loss  [] Fever  [] Chills Cardiac: [] Chest pain   [] Chest pressure   [] Palpitations   [] Shortness of breath when laying  flat   [] Shortness of breath with exertion. Vascular:  [x] Pain in legs with walking   [] Pain in legs at rest  [] History of DVT   [] Phlebitis   [] Swelling in legs   [] Varicose veins   [] Non-healing ulcers Pulmonary:   [] Uses home oxygen   [] Productive cough   [] Hemoptysis   [] Wheeze  [] COPD   [] Asthma Neurologic:  [] Dizziness   [] Seizures   [] History of stroke   [] History of TIA  [] Aphasia   [] Vissual changes   [] Weakness or numbness in arm   [] Weakness or numbness in leg Musculoskeletal:   [] Joint swelling   [] Joint pain   [] Low back pain Hematologic:  [] Easy bruising  [] Easy bleeding    [] Hypercoagulable state   [] Anemic Gastrointestinal:  [] Diarrhea   [] Vomiting  [x] Gastroesophageal reflux/heartburn   [] Difficulty swallowing. Genitourinary:  [] Chronic kidney disease   [] Difficult urination  [] Frequent urination   [] Blood in urine Skin:  [] Rashes   [] Ulcers  Psychological:  [] History of anxiety   []  History of major depression.  Physical Examination  Vitals:   01/30/20 0927  BP: (!) 145/76  Pulse: 94  Resp: 16  Weight: 141 lb 6.4 oz (64.1 kg)   Body mass index is 24.27 kg/m. Gen: WD/WN, NAD Head: [place]/AT, No temporalis wasting.  Ear/Nose/Throat: Hearing grossly intact, nares w/o erythema or drainage Eyes: PER, EOMI, sclera nonicteric.  Neck: Supple, no large masses.   Pulmonary:  Good air movement, no audible wheezing bilaterally, no use of accessory muscles.  Cardiac: RRR, no JVD Vascular:  Vessel Right Left  Radial Palpable Palpable  PT Not Palpable Not Palpable  DP Not Palpable Not Palpable  Gastrointestinal: Non-distended. No guarding/no peritoneal signs.  Musculoskeletal: M/S 5/5 throughout.  No deformity or atrophy.  Neurologic: CN 2-12 intact. Symmetrical.  Speech is fluent. Motor exam as listed above. Psychiatric: Judgment intact, Mood & affect appropriate for pt's clinical situation. Dermatologic: No rashes or ulcers noted.  No changes consistent with cellulitis.   CBC Lab Results  Component Value Date   WBC 8.4 04/13/2018   HGB 12.9 04/13/2018   HCT 39.2 04/13/2018   MCV 84 04/13/2018   PLT 318 04/13/2018    BMET    Component Value Date/Time   NA 144 04/13/2018 0905   K 3.9 04/13/2018 0905   CL 104 04/13/2018 0905   CO2 24 04/13/2018 0905   GLUCOSE 97 04/13/2018 0905   BUN 8 04/13/2018 0905   CREATININE 0.81 01/26/2020 1013   CALCIUM 9.7 04/13/2018 0905   GFRNONAA >60 01/26/2020 1013   GFRAA 100 04/13/2018 0905   CrCl cannot be calculated (Unknown ideal weight.).  COAG No results found for: INR, PROTIME  Radiology CT ANGIO  AO+BIFEM W & OR WO CONTRAST  Result Date: 01/26/2020 CLINICAL DATA:  Bilateral leg pain for several months. History of occluded aortobifemoral graft. EXAM: CT ANGIOGRAPHY OF ABDOMINAL AORTA WITH ILIOFEMORAL RUNOFF TECHNIQUE: Multidetector CT imaging of the abdomen, pelvis and lower extremities was performed using the standard protocol during bolus administration of intravenous contrast. Multiplanar CT image reconstructions and MIPs were obtained to evaluate the vascular anatomy. CONTRAST:  OMNIPAQUE IOHEXOL 350 MG/ML SOLN COMPARISON:  Abdominal CT 03/13/2011 FINDINGS: VASCULAR Aorta: History of aortobifemoral bypass procedure. The abdominal aorta occludes below the renal arteries. The aortobifemoral bypass graft is completely occluded. Native distal abdominal aorta and common iliac arteries are occluded. Stents in the native common iliac arteries. Celiac: High-grade stenosis in the celiac trunk just beyond the origin measuring at least  70%. Main branches of the celiac trunk are patent. SMA: Patent without evidence of aneurysm, dissection, vasculitis or significant stenosis. Renals: Both renal arteries are patent without evidence of aneurysm, dissection, vasculitis, fibromuscular dysplasia or significant stenosis. IMA: Origin is occluded. Distal reconstitution through collateral flow. RIGHT Lower Extremity Inflow: Right femoral bypass graft is occluded. The native right common and external iliac arteries are occluded. Distal reconstitution of the right internal iliac arteries. Outflow: Reconstitution of the right common femoral artery through the collateral flow in the right abdominal wall. Right profunda arteries are patent. Right SFA is patent without significant stenosis. Mild atherosclerotic disease in the right popliteal artery without significant stenosis. Runoff: Patent three vessel runoff to the ankle. LEFT Lower Extremity Inflow: Left aortofemoral graft limb is occluded. The native left common and  external iliac arteries are occluded. Minimal flow in the left internal iliac arteries. Outflow: Reconstitution of left common femoral arteries through collateral flow. Short segment left common femoral artery. Left profunda femoral arteries are patent. Left SFA is patent without significant stenosis. Left popliteal artery is patent without significant stenosis. Runoff: Patent three vessel runoff to the ankle. Veins: No obvious venous abnormality within the limitations of this arterial phase study. Review of the MIP images confirms the above findings. NON-VASCULAR Lower chest: Few patchy dependent densities are most suggestive for atelectasis or mild scarring. Questionable sub solid nodule in the posterior right lower lobe on sequence 4, image 5 measures 5 mm. No pleural effusions. Hepatobiliary: Diffuse low-density in the liver is compatible with steatosis. Normal appearance of the gallbladder. Pancreas: Unremarkable. No pancreatic ductal dilatation or surrounding inflammatory changes. Spleen: Normal in size without focal abnormality. Adrenals/Urinary Tract: Slightly increased fullness of left adrenal gland compared to the previous examination and this is nonspecific. The right adrenal gland is within normal limits. Normal appearance of the right kidney. Cortical scarring involving the left kidney lower pole. Normal appearance of the urinary bladder. Stomach/Bowel: Normal appearance of the stomach. No evidence for small or large bowel dilatation. No evidence for obstruction or focal bowel inflammation. There appears to be a small normal appendix. Lymphatic: No abdominal or pelvic lymphadenopathy. Reproductive: Uterus and bilateral adnexa are unremarkable. Other: Negative for ascites.  Negative for free air. Musculoskeletal: No acute bone abnormality. IMPRESSION: VASCULAR 1. Occlusion of the aortobifemoral bypass. Occlusion of the infrarenal abdominal aorta, native common and external iliac arteries. 2. Bilateral  infrainguinal arteries are patent without significant stenosis. Three-vessel runoff in both lower extremities. 3. High-grade stenosis in the proximal celiac trunk. NON-VASCULAR 1. Diffuse low density in the liver is compatible with steatosis. 2. Questionable 5 mm nodule along the periphery of the right lower lobe. No follow-up needed if patient is low-risk. Non-contrast chest CT can be considered in 12 months if patient is high-risk. This recommendation follows the consensus statement: Guidelines for Management of Incidental Pulmonary Nodules Detected on CT Images: From the Fleischner Society 2017; Radiology 2017; 284:228-243. Electronically Signed   By: Richarda Overlie M.D.   On: 01/26/2020 17:03   VAS [place] ABI WITH/WO TBI  Result Date: 01/19/2020 LOWER EXTREMITY DOPPLER STUDY Indications: Peripheral artery disease, and Hx of occluded aorto-bifem graft              seen on MRI 06/14/2014.  Comparison Study: 10/28/2018 Performing Technologist: Reece Agar RT (R)(VS)  Examination Guidelines: A complete evaluation includes at minimum, Doppler waveform signals and systolic blood pressure reading at the level of bilateral brachial, anterior tibial, and posterior tibial arteries, when vessel  segments are accessible. Bilateral testing is considered an integral part of a complete examination. Photoelectric Plethysmograph (PPG) waveforms and toe systolic pressure readings are included as required and additional duplex testing as needed. Limited examinations for reoccurring indications may be performed as noted.  ABI Findings: +---------+------------------+-----+----------+--------+ Right    Rt Pressure (mmHg)IndexWaveform  Comment  +---------+------------------+-----+----------+--------+ Brachial 133                                       +---------+------------------+-----+----------+--------+ ATA      46                0.35 monophasic         +---------+------------------+-----+----------+--------+ PTA       49                0.37 monophasic         +---------+------------------+-----+----------+--------+ Great Toe8                 0.06 Abnormal           +---------+------------------+-----+----------+--------+ +---------+------------------+-----+----------+-------+ Left     Lt Pressure (mmHg)IndexWaveform  Comment +---------+------------------+-----+----------+-------+ Brachial 133                                      +---------+------------------+-----+----------+-------+ ATA      78                0.59 monophasic        +---------+------------------+-----+----------+-------+ PTA      81                0.61 monophasic        +---------+------------------+-----+----------+-------+ Great Toe50                0.38 Abnormal          +---------+------------------+-----+----------+-------+ +-------+-----------+-----------+------------+------------+ ABI/TBIToday's ABIToday's TBIPrevious ABIPrevious TBI +-------+-----------+-----------+------------+------------+ Right  .37        .06        .62                      +-------+-----------+-----------+------------+------------+ Left   .61        .38        .74                      +-------+-----------+-----------+------------+------------+ Right ABIs appear decreased compared to prior study on 10/28/2018. Left ABIs appear essentially unchanged compared to prior study on 10/28/2018.  Summary: Right: Resting right ankle-brachial index indicates severe right lower extremity arterial disease. The right toe-brachial index is abnormal. Left: Resting left ankle-brachial index indicates moderate left lower extremity arterial disease. The left toe-brachial index is abnormal. *See table(s) above for measurements and observations.  Electronically signed by Levora DredgeGregory Mckinleigh Schuchart MD on 01/19/2020 at 4:29:42 PM.   Final      Assessment/Plan 1. Atherosclerosis of native artery of both lower extremities with intermittent claudication  (HCC)  Recommend:  The patient has evidence of atherosclerosis of the lower extremities with claudication.  Although the patient does have lifestyle limiting symptoms, she is not suffering with rest pain and there are no open wounds sores or other evidence for tissue loss.  Given that she has already failed intervention and iliac stenting as well as aortobifemoral bypass there is no straightforward simple fix.  I did discuss axillofemoral femorofemoral with her I also discussed redo aortobifemoral bypass grafting which would likely be through a thoracoabdominal incision and have the one advantage that her celiac lesion could be treated if indeed this became symptomatic (at this time I do not believe the celiac stenosis needs to be addressed as she is asymptomatic).  I also discussed that redo aortobifemoral bypass grafting would be something we would refer to [facility] or [facility].  At the present time she is not interested in extensive surgical revascularization and wishes to continue with conservative therapy.  Noninvasive studies do not suggest clinically significant change.  No invasive studies, angiography or surgery at this time The patient should continue walking and begin a more formal exercise program.  The patient should continue antiplatelet therapy and aggressive treatment of the lipid abnormalities  No changes in the patient's medications at this time   A total of 25 minutes was spent with this patient and greater than 50% was spent in counseling and coordination of care with the patient.  Discussion included the treatment options for vascular disease including indications for surgery and intervention.  Also discussed is the appropriate timing of treatment.  In addition medical therapy was discussed.  - VAS [place] ABI WITH/WO TBI; Future  2. Stenosis of celiac artery (HCC) This is asymptomatic at this time her SMA is widely patent.  We will continue to follow this and would only recommend  intervention if she becomes symptomatic  3. Primary hypertension Continue antihypertensive medications as already ordered, these medications have been reviewed and there are no changes at this time.   4. Mixed hyperlipidemia Continue statin as ordered and reviewed, no changes at this time   5. Gastroesophageal reflux disease, unspecified whether esophagitis present Continue PPI as already ordered, this medication has been reviewed and there are no changes at this time.  Avoidence of caffeine and alcohol  Moderate elevation of the head of the bed     Levora Dredge, MD  02/01/2020 12:25 PM

## 2020-02-20 ENCOUNTER — Ambulatory Visit (INDEPENDENT_AMBULATORY_CARE_PROVIDER_SITE_OTHER): Payer: Medicare Other | Admitting: Vascular Surgery

## 2020-06-19 ENCOUNTER — Other Ambulatory Visit: Payer: Self-pay | Admitting: Family Medicine

## 2020-06-19 DIAGNOSIS — Z1231 Encounter for screening mammogram for malignant neoplasm of breast: Secondary | ICD-10-CM

## 2020-07-23 ENCOUNTER — Encounter (INDEPENDENT_AMBULATORY_CARE_PROVIDER_SITE_OTHER): Payer: Medicare Other

## 2020-07-23 ENCOUNTER — Ambulatory Visit (INDEPENDENT_AMBULATORY_CARE_PROVIDER_SITE_OTHER): Payer: Medicare Other | Admitting: Vascular Surgery

## 2021-01-28 ENCOUNTER — Other Ambulatory Visit: Payer: Self-pay | Admitting: Family Medicine

## 2021-01-28 DIAGNOSIS — Z1231 Encounter for screening mammogram for malignant neoplasm of breast: Secondary | ICD-10-CM

## 2021-02-25 ENCOUNTER — Encounter (INDEPENDENT_AMBULATORY_CARE_PROVIDER_SITE_OTHER): Payer: Self-pay | Admitting: Vascular Surgery

## 2021-02-25 ENCOUNTER — Other Ambulatory Visit: Payer: Self-pay

## 2021-02-25 ENCOUNTER — Ambulatory Visit (INDEPENDENT_AMBULATORY_CARE_PROVIDER_SITE_OTHER): Payer: Medicare Other | Admitting: Vascular Surgery

## 2021-02-25 VITALS — BP 145/78 | HR 98 | Ht 64.0 in | Wt 146.0 lb

## 2021-02-25 DIAGNOSIS — N83201 Unspecified ovarian cyst, right side: Secondary | ICD-10-CM

## 2021-02-25 DIAGNOSIS — I10 Essential (primary) hypertension: Secondary | ICD-10-CM

## 2021-02-25 DIAGNOSIS — I70213 Atherosclerosis of native arteries of extremities with intermittent claudication, bilateral legs: Secondary | ICD-10-CM

## 2021-02-25 DIAGNOSIS — I771 Stricture of artery: Secondary | ICD-10-CM | POA: Diagnosis not present

## 2021-02-25 DIAGNOSIS — E782 Mixed hyperlipidemia: Secondary | ICD-10-CM

## 2021-02-25 DIAGNOSIS — N83202 Unspecified ovarian cyst, left side: Secondary | ICD-10-CM

## 2021-02-25 NOTE — Progress Notes (Signed)
MRN : [id number]  Rebekah Perry is a 65 y.o. (02-18-1956) female who presents with chief complaint of check circulation.  History of Present Illness:  The patient returns to the office for followup and review of the noninvasive studies. There have been no interval changes in lower extremity symptoms, she continues to have short distance claudication which she admits impedes with her daily activities. No interval shortening of the patient's claudication distance or worsening of rest pain symptoms. No new ulcers or wounds have occurred since the last visit.   Patient denies postprandial abdominal pain or weight loss   There have been no significant changes to the patient's overall health care.   The patient denies amaurosis fugax or recent TIA symptoms. There are no recent neurological changes noted. The patient denies history of DVT, PE or superficial thrombophlebitis. The patient denies recent episodes of angina or shortness of breath.    Previous CT angiogram was reviewed by me with the patient and her daughter.  There are 4 salient points based on this study.  #1 previously placed stents within the iliac system are occluded.  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  #4 the bilateral common femoral arteries and the distal runoff is preserved and there is minimal disease noted on the CT scan from the level of the femorals down to the feet.  Current Meds  Medication Sig   fluticasone (FLONASE) 50 MCG/ACT nasal spray SPRAY 1 TO 2 SPRAYS IN EACH NOSTRIL AT BEDTIME   hydrochlorothiazide (MICROZIDE) 12.5 MG capsule Take 12.5 mg by mouth daily.   losartan (COZAAR) 50 MG tablet Take 100 mg by mouth.    nitroGLYCERIN (NITROSTAT) 0.4 MG SL tablet PLACE 1 TAB UNDER TONGUE EVERY 5 MIN AS NEEDED FOR CHEST PAIN-IF USE 3 TABS OR PAIN PERSISTS-GO TO E   omeprazole (PRILOSEC) 20 MG capsule Take by mouth.   oxybutynin (DITROPAN) 5 MG  tablet Take 5 mg by mouth 3 (three) times daily.   [DISCONTINUED] solifenacin (VESICARE) 5 MG tablet Take 1 tablet by mouth daily.    Past Medical History:  Diagnosis Date   GERD (gastroesophageal reflux disease)    Hypercholesteremia    Hypertension     Past Surgical History:  Procedure Laterality Date   COLONOSCOPY WITH PROPOFOL N/A 01/18/2019   Procedure: COLONOSCOPY WITH PROPOFOL;  Surgeon: [name], MD;  Location: [facility] ENDOSCOPY;  Service: Gastroenterology;  Laterality: N/A;   Stent implant  2009   [facility]  Right Iliac     Social History Social History   Tobacco Use   Smoking status: Every Day    Types: Cigarettes   Smokeless tobacco: Never  Substance Use Topics   Alcohol use: Yes    Comment: Occ   Drug use: Not Currently    Family History Family History  Problem Relation Age of Onset   Pancreatic cancer Mother    Throat cancer Father    Throat cancer Sister     Allergies  Allergen Reactions   Sulfa Antibiotics Swelling     REVIEW OF SYSTEMS (Negative unless checked)  Constitutional: [] Weight loss  [] Fever  [] Chills Cardiac: [] Chest pain   [] Chest pressure   [] Palpitations   [] Shortness of breath when laying flat   [] Shortness of breath with exertion. Vascular:  [x] Pain in legs with walking   [] Pain in legs at rest  [] History of DVT   [] Phlebitis   [] Swelling  in legs   [] Varicose veins   [] Non-healing ulcers Pulmonary:   [] Uses home oxygen   [] Productive cough   [] Hemoptysis   [] Wheeze  [] COPD   [] Asthma Neurologic:  [] Dizziness   [] Seizures   [] History of stroke   [] History of TIA  [] Aphasia   [] Vissual changes   [] Weakness or numbness in arm   [] Weakness or numbness in leg Musculoskeletal:   [] Joint swelling   [] Joint pain   [] Low back pain Hematologic:  [] Easy bruising  [] Easy bleeding   [] Hypercoagulable state   [] Anemic Gastrointestinal:  [] Diarrhea   [] Vomiting  [x] Gastroesophageal reflux/heartburn   [] Difficulty swallowing. Genitourinary:   [] Chronic kidney disease   [] Difficult urination  [] Frequent urination   [] Blood in urine Skin:  [] Rashes   [] Ulcers  Psychological:  [] History of anxiety   []  History of major depression.  Physical Examination  Vitals:   02/25/21 0935  BP: (!) 145/78  Pulse: 98  Weight: 146 lb (66.2 kg)  Height: 5\' 4"  (1.626 m)   Body mass index is 25.06 kg/m. Gen: WD/WN, NAD Head: /AT, No temporalis wasting.  Ear/Nose/Throat: Hearing grossly intact, nares w/o erythema or drainage Eyes: PER, EOMI, sclera nonicteric.  Neck: Supple, no masses.  No bruit or JVD.  Pulmonary:  Good air movement, no audible wheezing, no use of accessory muscles.  Cardiac: RRR, normal S1, S2, no Murmurs. Vascular:   Vessel Right Left  Radial Palpable Palpable  PT Not Palpable Not Palpable  DP Not Palpable Not Palpable  Gastrointestinal: soft, non-distended. No guarding/no peritoneal signs.  Musculoskeletal: M/S 5/5 throughout.  No visible deformity.  Neurologic: CN 2-12 intact. Pain and light touch intact in extremities.  Symmetrical.  Speech is fluent. Motor exam as listed above. Psychiatric: Judgment intact, Mood & affect appropriate for pt's clinical situation. Dermatologic: No rashes or ulcers noted.  No changes consistent with cellulitis.   CBC Lab Results  Component Value Date   WBC 8.4 04/13/2018   HGB 12.9 04/13/2018   HCT 39.2 04/13/2018   MCV 84 04/13/2018   PLT 318 04/13/2018    BMET    Component Value Date/Time   NA 144 04/13/2018 0905   K 3.9 04/13/2018 0905   CL 104 04/13/2018 0905   CO2 24 04/13/2018 0905   GLUCOSE 97 04/13/2018 0905   BUN 8 04/13/2018 0905   CREATININE 0.81 01/26/2020 1013   CALCIUM 9.7 04/13/2018 0905   GFRNONAA >60 01/26/2020 1013   GFRAA 100 04/13/2018 0905   CrCl cannot be calculated (Patient's most recent lab result is older than the maximum 21 days allowed.).  COAG No results found for: INR, PROTIME  Radiology No results found.   Assessment/Plan 1.  Atherosclerosis of native artery of both lower extremities with intermittent claudication (HCC) Recommend:   The patient has evidence of atherosclerosis of the lower extremities with claudication.  Although the patient does have lifestyle limiting symptoms, she is not suffering with rest pain and there are no open wounds sores or other evidence for tissue loss.  This appears to be stable and unchanged compared to her previous visit approximately 1 year ago.  Given that she has already failed intervention and iliac stenting as well as aortobifemoral bypass there is no straightforward simple fix.  I did discuss axillofemoral femorofemoral with her I also discussed redo aortobifemoral bypass grafting which would likely be through a thoracoabdominal incision and have the one advantage that her celiac lesion could be treated if indeed this became symptomatic (at this time  I do not believe the celiac stenosis needs to be addressed as she is asymptomatic).  I also discussed that redo aortobifemoral bypass grafting would be something we would refer to [facility] or [facility].  At the present time she is not interested in extensive surgical revascularization and wishes to continue with conservative therapy.   Noninvasive studies do not suggest clinically significant change.   No invasive studies, angiography or surgery at this time The patient should continue walking and begin a more formal exercise program.  The patient should continue antiplatelet therapy and aggressive treatment of the lipid abnormalities   No changes in the patient's medications at this time - VAS [place] ABI WITH/WO TBI; Future  2. Stenosis of celiac artery (HCC) This is asymptomatic at this time her SMA is widely patent.  We will continue to follow this and would only recommend intervention if she becomes symptomatic  - VAS [place] MESENTERIC; Future  3. Primary hypertension Continue antihypertensive medications as already ordered, these medications have  been reviewed and there are no changes at this time.  4. Bilateral ovarian cysts She is requesting a referral. - Ambulatory referral to Obstetrics / Gynecology  5. Mixed hyperlipidemia Continue PPI as already ordered, this medication has been reviewed and there are no changes at this time.   Avoidence of caffeine and alcohol   Moderate elevation of the head of the bed     Levora Dredge, MD  02/25/2021 9:56 AM

## 2021-02-26 ENCOUNTER — Encounter (INDEPENDENT_AMBULATORY_CARE_PROVIDER_SITE_OTHER): Payer: Self-pay | Admitting: Vascular Surgery

## 2021-02-26 ENCOUNTER — Ambulatory Visit
Admission: RE | Admit: 2021-02-26 | Discharge: 2021-02-26 | Disposition: A | Discharge status: Home / Self Care | Payer: Medicare Other | Source: Ambulatory Visit | Attending: Family Medicine | Admitting: Family Medicine

## 2021-02-26 DIAGNOSIS — N83202 Unspecified ovarian cyst, left side: Secondary | ICD-10-CM | POA: Insufficient documentation

## 2021-02-26 DIAGNOSIS — Z1231 Encounter for screening mammogram for malignant neoplasm of breast: Secondary | ICD-10-CM | POA: Diagnosis present

## 2021-02-26 DIAGNOSIS — N83201 Unspecified ovarian cyst, right side: Secondary | ICD-10-CM | POA: Insufficient documentation

## 2021-02-28 ENCOUNTER — Other Ambulatory Visit: Payer: Self-pay | Admitting: *Deleted

## 2021-02-28 ENCOUNTER — Inpatient Hospital Stay
Admission: RE | Admit: 2021-02-28 | Discharge: 2021-02-28 | Disposition: A | Discharge status: Home / Self Care | Payer: Self-pay | Source: Ambulatory Visit | Attending: *Deleted | Admitting: *Deleted

## 2021-02-28 DIAGNOSIS — Z1231 Encounter for screening mammogram for malignant neoplasm of breast: Secondary | ICD-10-CM

## 2021-08-26 ENCOUNTER — Encounter (INDEPENDENT_AMBULATORY_CARE_PROVIDER_SITE_OTHER): Payer: Self-pay | Admitting: Vascular Surgery

## 2021-08-26 ENCOUNTER — Ambulatory Visit (INDEPENDENT_AMBULATORY_CARE_PROVIDER_SITE_OTHER): Payer: Medicare Other

## 2021-08-26 ENCOUNTER — Ambulatory Visit (INDEPENDENT_AMBULATORY_CARE_PROVIDER_SITE_OTHER): Payer: Medicare Other | Admitting: Vascular Surgery

## 2021-08-26 VITALS — BP 150/76 | HR 98 | Resp 16 | Ht 64.0 in | Wt 144.0 lb

## 2021-08-26 DIAGNOSIS — I70213 Atherosclerosis of native arteries of extremities with intermittent claudication, bilateral legs: Secondary | ICD-10-CM

## 2021-08-26 DIAGNOSIS — I771 Stricture of artery: Secondary | ICD-10-CM

## 2021-08-26 DIAGNOSIS — E782 Mixed hyperlipidemia: Secondary | ICD-10-CM

## 2021-08-26 DIAGNOSIS — I10 Essential (primary) hypertension: Secondary | ICD-10-CM | POA: Diagnosis not present

## 2021-08-26 DIAGNOSIS — K551 Chronic vascular disorders of intestine: Secondary | ICD-10-CM

## 2021-08-26 DIAGNOSIS — K219 Gastro-esophageal reflux disease without esophagitis: Secondary | ICD-10-CM

## 2021-08-26 NOTE — Progress Notes (Signed)
? ? ? ? ?MRN : [id number] ? ?Rebekah Perry is a 66 y.o. (12/11/1955) female who presents with chief complaint of check circulation. ? ?History of Present Illness:  ? ?The patient returns to the office for followup and review of the noninvasive studies. There have been no interval changes in lower extremity symptoms, she continues to have stable claudication which she feels she is doing ok with. No interval shortening of the patient's claudication distance or worsening of rest pain symptoms. No new ulcers or wounds have occurred since the last visit. ?  ?Patient denies postprandial abdominal pain or weight loss.  In fact she notes she has gained a little weight. ?  ?There have been no significant changes to the patient's overall health care. ?  ?The patient denies amaurosis fugax or recent TIA symptoms. There are no recent neurological changes noted. ?The patient denies history of DVT, PE or superficial thrombophlebitis. ?The patient denies recent episodes of angina or shortness of breath.  ?  ?Previous CT angiogram was reviewed by me with the patient and her daughter.  There are 4 salient points based on this study.  #1 previously placed stents within the iliac system are occluded.  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  #4 the bilateral common femoral arteries and the distal runoff is preserved and there is minimal disease noted on the CT scan from the level of the femorals down to the feet. ? ?Duplex ultrasound of the mesenteric arteries shows a stable celiac and SMA stenosis of 70-99% ? ?ABI's Rt=0.55 and Lt=0.60 (previous ABI's Rt=0.37 and Lt=0.61) ? ?Current Meds  ?Medication Sig  ? amLODipine (NORVASC) 10 MG tablet TAKE 1 TABLET BY MOUTH DAILY FOR HIGH BLOOD PRESSURE TO REPLACE 5 MG  ? aspirin EC 325 MG tablet Take 325 mg by mouth daily.  ? fluticasone (FLONASE) 50 MCG/ACT nasal spray SPRAY 1 TO 2 SPRAYS IN EACH NOSTRIL AT BEDTIME  ? losartan  (COZAAR) 50 MG tablet Take 100 mg by mouth.   ? nitroGLYCERIN (NITROSTAT) 0.4 MG SL tablet PLACE 1 TAB UNDER TONGUE EVERY 5 MIN AS NEEDED FOR CHEST PAIN-IF USE 3 TABS OR PAIN PERSISTS-GO TO E  ? omeprazole (PRILOSEC) 20 MG capsule Take by mouth.  ? oxybutynin (DITROPAN) 5 MG tablet Take 5 mg by mouth 3 (three) times daily.  ? pravastatin (PRAVACHOL) 40 MG tablet SMARTSIG:1 Tablet(s) By Mouth Every Evening  ? ? ?Past Medical History:  ?Diagnosis Date  ? GERD (gastroesophageal reflux disease)   ? Hypercholesteremia   ? Hypertension   ? ? ?Past Surgical History:  ?Procedure Laterality Date  ? COLONOSCOPY WITH PROPOFOL N/A 01/18/2019  ? Procedure: COLONOSCOPY WITH PROPOFOL;  Surgeon: [name], MD;  Location: [facility] ENDOSCOPY;  Service: Gastroenterology;  Laterality: N/A;  ? Stent implant  2009  ? [facility]  Right Iliac   ? ? ?Social History ?Social History  ? ?Tobacco Use  ? Smoking status: Every Day  ?  Types: Cigarettes  ? Smokeless tobacco: Never  ?Substance Use Topics  ? Alcohol use: Yes  ?  Comment: Occ  ? Drug use: Not Currently  ? ? ?Family History ?Family History  ?Problem Relation Age of Onset  ? Pancreatic cancer Mother   ? Throat cancer Father   ? Throat cancer Sister   ? ? ?Allergies  ?Allergen Reactions  ? Sulfa Antibiotics Swelling  ? ? ? ?REVIEW OF SYSTEMS (Negative  unless checked) ? ?Constitutional: [] Weight loss  [] Fever  [] Chills ?Cardiac: [] Chest pain   [] Chest pressure   [] Palpitations   [] Shortness of breath when laying flat   [] Shortness of breath with exertion. ?Vascular:  [x] Pain in legs with walking   [] Pain in legs at rest  [] History of DVT   [] Phlebitis   [] Swelling in legs   [] Varicose veins   [] Non-healing ulcers ?Pulmonary:   [] Uses home oxygen   [] Productive cough   [] Hemoptysis   [] Wheeze  [] COPD   [] Asthma ?Neurologic:  [] Dizziness   [] Seizures   [] History of stroke   [] History of TIA  [] Aphasia   [] Vissual changes   [] Weakness or numbness in arm   [] Weakness or numbness in  leg ?Musculoskeletal:   [] Joint swelling   [] Joint pain   [] Low back pain ?Hematologic:  [] Easy bruising  [] Easy bleeding   [] Hypercoagulable state   [] Anemic ?Gastrointestinal:  [] Diarrhea   [] Vomiting  [x] Gastroesophageal reflux/heartburn   [] Difficulty swallowing. ?Genitourinary:  [] Chronic kidney disease   [] Difficult urination  [] Frequent urination   [] Blood in urine ?Skin:  [] Rashes   [] Ulcers  ?Psychological:  [] History of anxiety   []  History of major depression. ? ?Physical Examination ? ?Vitals:  ? 08/26/21 0900  ?BP: (!) 150/76  ?Pulse: 98  ?Resp: 16  ?Weight: 144 lb (65.3 kg)  ?Height: 5\' 4"  (1.626 m)  ? ?Body mass index is 24.72 kg/m?. ?Gen: WD/WN, NAD ?Head: [place]/AT, No temporalis wasting.  ?Ear/Nose/Throat: Hearing grossly intact, nares w/o erythema or drainage ?Eyes: PER, EOMI, sclera nonicteric.  ?Neck: Supple, no masses.  No bruit or JVD.  ?Pulmonary:  Good air movement, no audible wheezing, no use of accessory muscles.  ?Cardiac: RRR, normal S1, S2, no Murmurs. ?Vascular:  mild trophic changes, no open wounds ?Vessel Right Left  ?Radial Palpable Palpable  ?PT Not Palpable Not Palpable  ?DP Not Palpable Not Palpable  ?Gastrointestinal: soft, non-distended. No guarding/no peritoneal signs.  ?Musculoskeletal: M/S 5/5 throughout.  No visible deformity.  ?Neurologic: CN 2-12 intact. Pain and light touch intact in extremities.  Symmetrical.  Speech is fluent. Motor exam as listed above. ?Psychiatric: Judgment intact, Mood & affect appropriate for pt's clinical situation. ?Dermatologic: No rashes or ulcers noted.  No changes consistent with cellulitis. ? ? ?CBC ?Lab Results  ?Component Value Date  ? WBC 8.4 04/13/2018  ? HGB 12.9 04/13/2018  ? HCT 39.2 04/13/2018  ? MCV 84 04/13/2018  ? PLT 318 04/13/2018  ? ? ?BMET ?   ?Component Value Date/Time  ? NA 144 04/13/2018 0905  ? K 3.9 04/13/2018 0905  ? CL 104 04/13/2018 0905  ? CO2 24 04/13/2018 0905  ? GLUCOSE 97 04/13/2018 0905  ? BUN 8 04/13/2018 0905  ?  CREATININE 0.81 01/26/2020 1013  ? CALCIUM 9.7 04/13/2018 0905  ? GFRNONAA >60 01/26/2020 1013  ? GFRAA 100 04/13/2018 0905  ? ?CrCl cannot be calculated (Patient's most recent lab result is older than the maximum 21 days allowed.). ? ?COAG ?No results found for: INR, PROTIME ? ?Radiology ?No results found. ? ? ?Assessment/Plan ?1. Chronic mesenteric ischemia (HCC) ?Recommend: ? ?The patient has evidence of chronic asymptomatic mesenteric atherosclerosis.  The patient denies lifestyle limiting changes at this point in time.  Given the lack of symptoms no intervention is warranted at this time.  ? ?No further invasive studies, angiography or surgery at this time ? ?The patient should continue walking and begin a more formal exercise program. ?  ?The patient should continue  antiplatelet therapy and aggressive treatment of the lipid abnormalities ? ?Patient should undergo noninvasive studies as ordered. ?The patient will follow up with me after the studies.   ?- VAS US MESENTERIC; Future ? ?2. Atherosclerosis of native artery of both lower extremities with intermittent claudication (HCC) ? Recommend: ? ?The patient has evidence of atherosclerosis of the lower extremities with claudication.  The patient does not voice lifestyle limiting changes at this point in time. ? ?Noninvasive studies do not suggest clinically significant change. ? ?No invasive studies, angiography or surgery at this time ?The patient should continue walking and begin a more formal exercise program.  ?The patient should continue antiplatelet therapy and aggressive treatment of the lipid abnormalities ? ?No changes in the patient's medications at this time ? ?Continued surveillance is indicated as atherosclerosis is likely to progress with time.   ? ?The patient will continue follow up with noninvasive studies as ordered.   ?- VAS US ABI WITH/WO TBI; Future ? ?3. Primary hypertension ?Continue antihypertensive medications as already ordered, these  medications have been reviewed and there are no changes at this time.  ? ?4. Mixed hyperlipidemia ?Continue statin as ordered and reviewed, no changes at this time  ? ?5. Gastroesophageal reflux disease, unspeci

## 2022-02-21 ENCOUNTER — Other Ambulatory Visit (INDEPENDENT_AMBULATORY_CARE_PROVIDER_SITE_OTHER): Payer: Self-pay | Admitting: Vascular Surgery

## 2022-02-21 DIAGNOSIS — K551 Chronic vascular disorders of intestine: Secondary | ICD-10-CM

## 2022-02-21 DIAGNOSIS — I739 Peripheral vascular disease, unspecified: Secondary | ICD-10-CM

## 2022-02-26 NOTE — Progress Notes (Incomplete)
MRN : [id number]  Rebekah Perry is a 66 y.o. (04-14-1956) female who presents with chief complaint of check circulation.  History of Present Illness:     No outpatient medications have been marked as taking for the 02/27/22 encounter (Appointment) with Gilda Crease, Latina Craver, MD.    Past Medical History:  Diagnosis Date   GERD (gastroesophageal reflux disease)    Hypercholesteremia    Hypertension     Past Surgical History:  Procedure Laterality Date   COLONOSCOPY WITH PROPOFOL N/A 01/18/2019   Procedure: COLONOSCOPY WITH PROPOFOL;  Surgeon: [name], MD;  Location: [facility] ENDOSCOPY;  Service: Gastroenterology;  Laterality: N/A;   Stent implant  2009   [facility]  Right Iliac     Social History Social History   Tobacco Use   Smoking status: Every Day    Types: Cigarettes   Smokeless tobacco: Never  Substance Use Topics   Alcohol use: Yes    Comment: Occ   Drug use: Not Currently    Family History Family History  Problem Relation Age of Onset   Pancreatic cancer Mother    Throat cancer Father    Throat cancer Sister     Allergies  Allergen Reactions   Sulfa Antibiotics Swelling     REVIEW OF SYSTEMS (Negative unless checked)  Constitutional: [] Weight loss  [] Fever  [] Chills Cardiac: [] Chest pain   [] Chest pressure   [] Palpitations   [] Shortness of breath when laying flat   [] Shortness of breath with exertion. Vascular:  [x] Pain in legs with walking   [] Pain in legs at rest  [] History of DVT   [] Phlebitis   [] Swelling in legs   [] Varicose veins   [] Non-healing ulcers Pulmonary:   [] Uses home oxygen   [] Productive cough   [] Hemoptysis   [] Wheeze  [] COPD   [] Asthma Neurologic:  [] Dizziness   [] Seizures   [] History of stroke   [] History of TIA  [] Aphasia   [] Vissual changes   [] Weakness or numbness in arm   [] Weakness or numbness in leg Musculoskeletal:   [] Joint swelling   [] Joint pain   [] Low back pain Hematologic:  [] Easy bruising  [] Easy  bleeding   [] Hypercoagulable state   [] Anemic Gastrointestinal:  [] Diarrhea   [] Vomiting  [] Gastroesophageal reflux/heartburn   [] Difficulty swallowing. Genitourinary:  [] Chronic kidney disease   [] Difficult urination  [] Frequent urination   [] Blood in urine Skin:  [] Rashes   [] Ulcers  Psychological:  [] History of anxiety   []  History of major depression.  Physical Examination  There were no vitals filed for this visit. There is no height or weight on file to calculate BMI. Gen: WD/WN, NAD Head: [name]/AT, No temporalis wasting.  Ear/Nose/Throat: Hearing grossly intact, nares w/o erythema or drainage Eyes: PER, EOMI, sclera nonicteric.  Neck: Supple, no masses.  No bruit or JVD.  Pulmonary:  Good air movement, no audible wheezing, no use of accessory muscles.  Cardiac: RRR, normal S1, S2, no Murmurs. Vascular:  mild trophic changes, no open wounds Vessel Right Left  Radial Palpable Palpable  PT Not Palpable Not Palpable  DP Not Palpable Not Palpable  Gastrointestinal: soft, non-distended. No guarding/no peritoneal signs.  Musculoskeletal: M/S 5/5 throughout.  No visible deformity.  Neurologic: CN 2-12 intact. Pain and light touch intact in extremities.  Symmetrical.  Speech is fluent. Motor exam as listed above. Psychiatric: Judgment intact, Mood & affect appropriate for pt's clinical situation. Dermatologic: No rashes or  ulcers noted.  No changes consistent with cellulitis.   CBC Lab Results  Component Value Date   WBC 8.4 04/13/2018   HGB 12.9 04/13/2018   HCT 39.2 04/13/2018   MCV 84 04/13/2018   PLT 318 04/13/2018    BMET    Component Value Date/Time   NA 144 04/13/2018 0905   K 3.9 04/13/2018 0905   CL 104 04/13/2018 0905   CO2 24 04/13/2018 0905   GLUCOSE 97 04/13/2018 0905   BUN 8 04/13/2018 0905   CREATININE 0.81 01/26/2020 1013   CALCIUM 9.7 04/13/2018 0905   GFRNONAA >60 01/26/2020 1013   GFRAA 100 04/13/2018 0905   CrCl cannot be calculated (Patient's most  recent lab result is older than the maximum 21 days allowed.).  COAG No results found for: "INR", "PROTIME"  Radiology No results found.   Assessment/Plan There are no diagnoses linked to this encounter.   Levora Dredge, MD  02/26/2022 10:21 AM

## 2022-02-27 ENCOUNTER — Encounter (INDEPENDENT_AMBULATORY_CARE_PROVIDER_SITE_OTHER): Payer: Medicare Other

## 2022-02-27 ENCOUNTER — Ambulatory Visit (INDEPENDENT_AMBULATORY_CARE_PROVIDER_SITE_OTHER): Payer: Medicare Other | Admitting: Vascular Surgery

## 2022-03-20 ENCOUNTER — Encounter: Payer: Self-pay | Admitting: Family Medicine

## 2022-03-20 DIAGNOSIS — Z1231 Encounter for screening mammogram for malignant neoplasm of breast: Secondary | ICD-10-CM

## 2022-05-07 NOTE — Progress Notes (Deleted)
MRN : [id number]  Rebekah Perry is a 67 y.o. (30-Jun-1955) female who presents with chief complaint of check circulation.  History of Present Illness:   The patient returns to the office for followup and review of the noninvasive studies. There have been no interval changes in lower extremity symptoms, she continues to have stable claudication which she feels she is doing ok with. No interval shortening of the patient's claudication distance or worsening of rest pain symptoms. No new ulcers or wounds have occurred since the last visit.   Patient denies postprandial abdominal pain or weight loss.  In fact she notes she has gained a little weight.   There have been no significant changes to the patient's overall health care.   The patient denies amaurosis fugax or recent TIA symptoms. There are no recent neurological changes noted. The patient denies history of DVT, PE or superficial thrombophlebitis. The patient denies recent episodes of angina or shortness of breath.    Previous CT angiogram was reviewed by me with the patient and her daughter.  There are 4 salient points based on this study.  #1 previously placed stents within the iliac system are occluded.  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  #4 the bilateral common femoral arteries and the distal runoff is preserved and there is minimal disease noted on the CT scan from the level of the femorals down to the feet.   Duplex ultrasound of the mesenteric arteries shows a stable celiac and SMA stenosis of 70-99%   ABI's Rt=0.55 and Lt=0.60 (previous ABI's Rt=0.37 and Lt=0.61)  No outpatient medications have been marked as taking for the 05/08/22 encounter (Appointment) with Delana Meyer, Dolores Lory, MD.    Past Medical History:  Diagnosis Date   GERD (gastroesophageal reflux disease)    Hypercholesteremia    Hypertension     Past Surgical History:   Procedure Laterality Date   COLONOSCOPY WITH PROPOFOL N/A 01/18/2019   Procedure: COLONOSCOPY WITH PROPOFOL;  Surgeon: Jonathon Bellows, MD;  Location: [facility] ENDOSCOPY;  Service: Gastroenterology;  Laterality: N/A;   Stent implant  2009   [facility]  Right Iliac     Social History Social History   Tobacco Use   Smoking status: Every Day    Types: Cigarettes   Smokeless tobacco: Never  Substance Use Topics   Alcohol use: Yes    Comment: Occ   Drug use: Not Currently    Family History Family History  Problem Relation Age of Onset   Pancreatic cancer Mother    Throat cancer Father    Throat cancer Sister     Allergies  Allergen Reactions   Sulfa Antibiotics Swelling     REVIEW OF SYSTEMS (Negative unless checked)  Constitutional: '[]'$ Weight loss  '[]'$ Fever  '[]'$ Chills Cardiac: '[]'$ Chest pain   '[]'$ Chest pressure   '[]'$ Palpitations   '[]'$ Shortness of breath when laying flat   '[]'$ Shortness of breath with exertion. Vascular:  '[x]'$ Pain in legs with walking   '[]'$ Pain in legs at rest  '[]'$ History of DVT   '[]'$ Phlebitis   '[]'$ Swelling in legs   '[]'$ Varicose veins   '[]'$ Non-healing ulcers Pulmonary:   '[]'$ Uses home oxygen   '[]'$ Productive cough   '[]'$ Hemoptysis   '[]'$ Wheeze  '[]'$ COPD   '[]'$ Asthma Neurologic:  '[]'$ Dizziness   '[]'$ Seizures   '[]'$ History of stroke   '[]'$ History of TIA  '[]'$   Aphasia   '[]'$ Vissual changes   '[]'$ Weakness or numbness in arm   '[]'$ Weakness or numbness in leg Musculoskeletal:   '[]'$ Joint swelling   '[]'$ Joint pain   '[]'$ Low back pain Hematologic:  '[]'$ Easy bruising  '[]'$ Easy bleeding   '[]'$ Hypercoagulable state   '[]'$ Anemic Gastrointestinal:  '[]'$ Diarrhea   '[]'$ Vomiting  '[x]'$ Gastroesophageal reflux/heartburn   '[]'$ Difficulty swallowing. Genitourinary:  '[]'$ Chronic kidney disease   '[]'$ Difficult urination  '[]'$ Frequent urination   '[]'$ Blood in urine Skin:  '[]'$ Rashes   '[]'$ Ulcers  Psychological:  '[]'$ History of anxiety   '[]'$  History of major depression.  Physical Examination  There were no vitals filed for this visit. There is no height or weight  on file to calculate BMI. Gen: WD/WN, NAD Head: [place]/AT, No temporalis wasting.  Ear/Nose/Throat: Hearing grossly intact, nares w/o erythema or drainage Eyes: PER, EOMI, sclera nonicteric.  Neck: Supple, no masses.  No bruit or JVD.  Pulmonary:  Good air movement, no audible wheezing, no use of accessory muscles.  Cardiac: RRR, normal S1, S2, no Murmurs. Vascular:  mild trophic changes, no open wounds Vessel Right Left  Radial Palpable Palpable  PT Not Palpable Not Palpable  DP Not Palpable Not Palpable  Gastrointestinal: soft, non-distended. No guarding/no peritoneal signs.  Musculoskeletal: M/S 5/5 throughout.  No visible deformity.  Neurologic: CN 2-12 intact. Pain and light touch intact in extremities.  Symmetrical.  Speech is fluent. Motor exam as listed above. Psychiatric: Judgment intact, Mood & affect appropriate for pt's clinical situation. Dermatologic: No rashes or ulcers noted.  No changes consistent with cellulitis.   CBC Lab Results  Component Value Date   WBC 8.4 04/13/2018   HGB 12.9 04/13/2018   HCT 39.2 04/13/2018   MCV 84 04/13/2018   PLT 318 04/13/2018    BMET    Component Value Date/Time   NA 144 04/13/2018 0905   K 3.9 04/13/2018 0905   CL 104 04/13/2018 0905   CO2 24 04/13/2018 0905   GLUCOSE 97 04/13/2018 0905   BUN 8 04/13/2018 0905   CREATININE 0.81 01/26/2020 1013   CALCIUM 9.7 04/13/2018 0905   GFRNONAA >60 01/26/2020 1013   GFRAA 100 04/13/2018 0905   CrCl cannot be calculated (Patient's most recent lab result is older than the maximum 21 days allowed.).  COAG No results found for: "INR", "PROTIME"  Radiology No results found.   Assessment/Plan There are no diagnoses linked to this encounter.   Hortencia Pilar, MD  05/07/2022 8:07 AM

## 2022-05-08 ENCOUNTER — Encounter (INDEPENDENT_AMBULATORY_CARE_PROVIDER_SITE_OTHER): Payer: Medicare Other

## 2022-05-08 ENCOUNTER — Ambulatory Visit (INDEPENDENT_AMBULATORY_CARE_PROVIDER_SITE_OTHER): Payer: 59 | Admitting: Vascular Surgery

## 2022-05-08 DIAGNOSIS — I10 Essential (primary) hypertension: Secondary | ICD-10-CM

## 2022-05-08 DIAGNOSIS — E782 Mixed hyperlipidemia: Secondary | ICD-10-CM

## 2022-05-08 DIAGNOSIS — I70213 Atherosclerosis of native arteries of extremities with intermittent claudication, bilateral legs: Secondary | ICD-10-CM

## 2022-05-08 DIAGNOSIS — K219 Gastro-esophageal reflux disease without esophagitis: Secondary | ICD-10-CM

## 2022-05-08 DIAGNOSIS — K551 Chronic vascular disorders of intestine: Secondary | ICD-10-CM

## 2022-06-04 ENCOUNTER — Ambulatory Visit (INDEPENDENT_AMBULATORY_CARE_PROVIDER_SITE_OTHER): Payer: 59 | Admitting: Nurse Practitioner

## 2022-06-04 ENCOUNTER — Ambulatory Visit (INDEPENDENT_AMBULATORY_CARE_PROVIDER_SITE_OTHER): Payer: 59

## 2022-06-04 ENCOUNTER — Encounter (INDEPENDENT_AMBULATORY_CARE_PROVIDER_SITE_OTHER): Payer: Self-pay | Admitting: Nurse Practitioner

## 2022-06-04 VITALS — BP 146/72 | HR 78 | Resp 18 | Ht 64.0 in | Wt 134.6 lb

## 2022-06-04 DIAGNOSIS — I10 Essential (primary) hypertension: Secondary | ICD-10-CM

## 2022-06-04 DIAGNOSIS — I70213 Atherosclerosis of native arteries of extremities with intermittent claudication, bilateral legs: Secondary | ICD-10-CM

## 2022-06-04 DIAGNOSIS — K551 Chronic vascular disorders of intestine: Secondary | ICD-10-CM | POA: Diagnosis not present

## 2022-06-04 DIAGNOSIS — I739 Peripheral vascular disease, unspecified: Secondary | ICD-10-CM

## 2022-06-04 DIAGNOSIS — E782 Mixed hyperlipidemia: Secondary | ICD-10-CM | POA: Diagnosis not present

## 2022-06-04 NOTE — Progress Notes (Signed)
Subjective:    Patient ID: Rebekah Perry, female    DOB: 05-09-1955, 67 y.o.   MRN: [id number] Chief Complaint  Patient presents with   Follow-up    f/u with abi and mesenteric in 6 months    Rebekah Perry is a 67 year old female that returns to the office for followup and review of the noninvasive studies. There have been no interval changes in lower extremity symptoms, she denies any significant claudication or worsening of her claudication.  No interval shortening of the patient's claudication distance or worsening of rest pain symptoms. No new ulcers or wounds have occurred since the last visit.   Patient denies postprandial abdominal pain or weight loss.  She denies any nausea or vomiting   There have been no significant changes to the patient's overall health care.   The patient denies amaurosis fugax or recent TIA symptoms. There are no recent neurological changes noted. The patient denies history of DVT, PE or superficial thrombophlebitis. The patient denies recent episodes of angina or shortness of breath.    Previous CT angiogram in 2021 notes the following, There are 4 salient points based on this study.  #1 previously placed stents within the iliac system are occluded.  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  #4 the bilateral common femoral arteries and the distal runoff is preserved and there is minimal disease noted on the CT scan from the level of the femorals down to the feet.  Today noninvasive studies show an ABI of 0.50 on the right and 0.57 on the left.  Previous ABIs 1 year ago were 0.55 on the right and 0.60 on the left.  The patient has monophasic tibial artery waveforms bilaterally with some dampened toe waveforms bilaterally.  The patient has noted 59 to 99% stenosis in the celiac and mesenteric arteries which is consistent with previous studies from last year.    Review of Systems   Gastrointestinal:  Negative for abdominal pain, nausea and vomiting.  Skin:  Negative for wound.  All other systems reviewed and are negative.      Objective:   Physical Exam Vitals reviewed.  HENT:     Head: Normocephalic.  Cardiovascular:     Rate and Rhythm: Normal rate.     Pulses:          Dorsalis pedis pulses are detected w/ Doppler on the right side and detected w/ Doppler on the left side.       Posterior tibial pulses are detected w/ Doppler on the right side and detected w/ Doppler on the left side.  Pulmonary:     Effort: Pulmonary effort is normal.  Skin:    General: Skin is warm and dry.  Neurological:     Mental Status: She is alert and oriented to person, place, and time.  Psychiatric:        Mood and Affect: Mood normal.        Behavior: Behavior normal.        Thought Content: Thought content normal.        Judgment: Judgment normal.     BP (!) 146/72 (BP Location: Right Arm)   Pulse 78   Resp 18   Ht 5' 4"$  (1.626 m)   Wt 134 lb 9.6 oz (61.1 kg)   BMI 23.10 kg/m   Past Medical History:  Diagnosis Date   GERD (gastroesophageal reflux disease)  Hypercholesteremia    Hypertension     Social History   Socioeconomic History   Marital status: Single    Spouse name: Not on file   Number of children: Not on file   Years of education: Not on file   Highest education level: Not on file  Occupational History   Not on file  Tobacco Use   Smoking status: Every Day    Types: Cigarettes   Smokeless tobacco: Never  Vaping Use   Vaping Use: Not on file  Substance and Sexual Activity   Alcohol use: Yes    Comment: Occ   Drug use: Not Currently   Sexual activity: Not Currently    Partners: Male    Birth control/protection: None  Other Topics Concern   Not on file  Social History Narrative   Not on file   Social Determinants of Health   Financial Resource Strain: Not on file  Food Insecurity: Not on file  Transportation Needs: Not on file   Physical Activity: Not on file  Stress: Not on file  Social Connections: Not on file  Intimate Partner Violence: Not on file    Past Surgical History:  Procedure Laterality Date   COLONOSCOPY WITH PROPOFOL N/A 01/18/2019   Procedure: COLONOSCOPY WITH PROPOFOL;  Surgeon: Jonathon Bellows, MD;  Location: [facility] ENDOSCOPY;  Service: Gastroenterology;  Laterality: N/A;   Stent implant  2009   [facility]  Right Iliac     Family History  Problem Relation Age of Onset   Pancreatic cancer Mother    Throat cancer Father    Throat cancer Sister     Allergies  Allergen Reactions   Sulfa Antibiotics Swelling       Latest Ref Rng & Units 04/13/2018    9:05 AM  CBC  WBC 3.4 - 10.8 x10E3/uL 8.4   Hemoglobin 11.1 - 15.9 g/dL 12.9   Hematocrit 34.0 - 46.6 % 39.2   Platelets 150 - 450 x10E3/uL 318       CMP     Component Value Date/Time   NA 144 04/13/2018 0905   K 3.9 04/13/2018 0905   CL 104 04/13/2018 0905   CO2 24 04/13/2018 0905   GLUCOSE 97 04/13/2018 0905   BUN 8 04/13/2018 0905   CREATININE 0.81 01/26/2020 1013   CALCIUM 9.7 04/13/2018 0905   PROT 6.6 04/13/2018 0905   ALBUMIN 3.9 04/13/2018 0905   AST 11 04/13/2018 0905   ALT 10 04/13/2018 0905   ALKPHOS 81 04/13/2018 0905   BILITOT 0.3 04/13/2018 0905   GFRNONAA >60 01/26/2020 1013   GFRAA 100 04/13/2018 0905     No results found.     Assessment & Plan:   1. Atherosclerosis of native artery of both lower extremities with intermittent claudication [facility]) Results reviewed with patient.  The patient has evidence of atherosclerosis of the lower extremities with claudication.  Although the patient denies having lifestyle limiting symptoms, she is not suffering with rest pain and there are no open wounds sores or other evidence for tissue loss.  This appears to be stable and unchanged compared to her previous visit approximately 1 year ago.  Given that she has already failed intervention and iliac stenting as well as  aortobifemoral bypass there is no straightforward simple fix.  I did discuss axillofemoral femorofemoral with her I also discussed redo aortobifemoral bypass grafting which would likely be through a thoracoabdominal incision and have the one advantage that her celiac lesion could be treated if  indeed this became symptomatic (at this time I do not believe the celiac stenosis needs to be addressed as she is asymptomatic).  I also discussed that redo aortobifemoral bypass grafting would be something we would refer to [facility] or [facility].  At the present time she is not interested in extensive surgical revascularization and wishes to continue with conservative therapy.   Noninvasive studies do not suggest clinically significant change.   No invasive studies, angiography or surgery at this time The patient should continue walking and begin a more formal exercise program.  The patient should continue antiplatelet therapy and aggressive treatment of the lipid abnormalities   No changes in the patient's medications at this time  2. Mesenteric artery stenosis (HCC) Recommend:  The patient has evidence of chronic asymptomatic mesenteric atherosclerosis.  The patient denies lifestyle limiting changes at this point in time.  Given the lack of symptoms no intervention is warranted at this time.   No further invasive studies, angiography or surgery at this time  The patient should continue walking and begin a more formal exercise program.   The patient should continue antiplatelet therapy and aggressive treatment of the lipid abnormalities  Patient should undergo noninvasive studies as ordered. The patient will follow up with me after the studies.   3. Primary hypertension The patient to be n.p.o. for studies today.  She notes she has not taken her medicine yet today hence her slight blood pressure elevation.  Continue antihypertensive medications as already ordered, these medications have been reviewed and there  are no changes at this time.  4. Mixed hyperlipidemia Continue statin as ordered and reviewed, no changes at this time   Current Outpatient Medications on File Prior to Visit  Medication Sig Dispense Refill   amLODipine (NORVASC) 10 MG tablet TAKE 1 TABLET BY MOUTH DAILY FOR HIGH BLOOD PRESSURE TO REPLACE 5 MG     aspirin EC 325 MG tablet Take 325 mg by mouth daily.     fluticasone (FLONASE) 50 MCG/ACT nasal spray SPRAY 1 TO 2 SPRAYS IN EACH NOSTRIL AT BEDTIME     losartan (COZAAR) 50 MG tablet Take 100 mg by mouth.      nitroGLYCERIN (NITROSTAT) 0.4 MG SL tablet PLACE 1 TAB UNDER TONGUE EVERY 5 MIN AS NEEDED FOR CHEST PAIN-IF USE 3 TABS OR PAIN PERSISTS-GO TO E     omeprazole (PRILOSEC) 20 MG capsule Take by mouth.     oxybutynin (DITROPAN) 5 MG tablet Take 5 mg by mouth 3 (three) times daily.     pravastatin (PRAVACHOL) 40 MG tablet SMARTSIG:1 Tablet(s) By Mouth Every Evening     albuterol (PROVENTIL HFA;VENTOLIN HFA) 108 (90 Base) MCG/ACT inhaler Inhale into the lungs.     amlodipine-atorvastatin (CADUET) 10-10 MG tablet Take 1 tablet by mouth daily. (Patient not taking: Reported on 02/25/2021)     aspirin EC 81 MG tablet Take 81 mg by mouth daily. (Patient not taking: Reported on 01/19/2020)     atorvastatin (LIPITOR) 20 MG tablet Take by mouth. (Patient not taking: Reported on 01/19/2020)     bisacodyl (DULCOLAX) 5 MG EC tablet Take by mouth. (Patient not taking: Reported on 01/19/2020)     hydrochlorothiazide (MICROZIDE) 12.5 MG capsule Take 12.5 mg by mouth daily. (Patient not taking: Reported on 08/26/2021)     No current facility-administered medications on file prior to visit.    There are no Patient Instructions on file for this visit. No follow-ups on file.   Kris Hartmann, NP

## 2022-06-06 LAB — VAS US ABI WITH/WO TBI
Left ABI: 0.57
Right ABI: 0.5

## 2022-11-07 ENCOUNTER — Encounter: Payer: Self-pay | Admitting: Urology

## 2022-11-07 ENCOUNTER — Ambulatory Visit (INDEPENDENT_AMBULATORY_CARE_PROVIDER_SITE_OTHER): Payer: 59 | Admitting: Urology

## 2022-11-07 VITALS — BP 108/66 | HR 101 | Ht 66.0 in | Wt 134.0 lb

## 2022-11-07 DIAGNOSIS — N3941 Urge incontinence: Secondary | ICD-10-CM

## 2022-11-07 DIAGNOSIS — R8281 Pyuria: Secondary | ICD-10-CM

## 2022-11-07 DIAGNOSIS — R300 Dysuria: Secondary | ICD-10-CM

## 2022-11-07 DIAGNOSIS — N3281 Overactive bladder: Secondary | ICD-10-CM | POA: Diagnosis not present

## 2022-11-07 LAB — URINALYSIS, COMPLETE
Bilirubin, UA: NEGATIVE
Glucose, UA: NEGATIVE
Ketones, UA: NEGATIVE
Nitrite, UA: NEGATIVE
Protein,UA: NEGATIVE
Specific Gravity, UA: 1.02 (ref 1.005–1.030)
Urobilinogen, Ur: 0.2 mg/dL (ref 0.2–1.0)
pH, UA: 6 (ref 5.0–7.5)

## 2022-11-07 LAB — MICROSCOPIC EXAMINATION

## 2022-11-07 MED ORDER — GEMTESA 75 MG PO TABS: 1.0000 | ORAL_TABLET | Freq: Every day | ORAL | Status: DC

## 2022-11-07 NOTE — Patient Instructions (Signed)
STOP OXYBUTYNIN

## 2022-11-07 NOTE — Progress Notes (Signed)
I, Rebekah Perry, acting as a scribe for Riki Altes, MD., have documented all relevant documentation on the behalf of Riki Altes, MD, as directed by Riki Altes, MD while in the presence of Riki Altes, MD.  11/07/2022 2:33 PM   Rebekah Perry 03/12/1956 [id number]  Referring provider: Oswaldo Conroy, MD [address] [place],  [place] [address]  Chief Complaint  Patient presents with   New Patient (Initial Visit)   Urinary Frequency    HPI: Rebekah Perry is a 67 y.o. female referred for evaluation of urinary urgency.   Long history of overactive bladder symptoms.  States she has been on oxybutynin for years. On review of records, it appears she is taking immediate release oxybutynin and only once a day. Complains of urinary frequency, urgency, and urge incontinence.  No dysuria or gross hematuria.  No flank, abdominal, or pelvic pain.   PMH: Past Medical History:  Diagnosis Date   GERD (gastroesophageal reflux disease)    Hypercholesteremia    Hypertension     Surgical History: Past Surgical History:  Procedure Laterality Date   COLONOSCOPY WITH PROPOFOL N/A 01/18/2019   Procedure: COLONOSCOPY WITH PROPOFOL;  Surgeon: [name], MD;  Location: [facility] ENDOSCOPY;  Service: Gastroenterology;  Laterality: N/A;   Stent implant  2009   [facility]  Right Iliac     Home Medications:  Allergies as of 11/07/2022       Reactions   Sulfa Antibiotics Swelling        Medication List        Accurate as of November 07, 2022  2:33 PM. If you have any questions, ask your nurse or doctor.          STOP taking these medications    amlodipine-atorvastatin 10-10 MG tablet Commonly known as: CADUET Stopped by: Riki Altes   aspirin EC 325 MG tablet Stopped by: Riki Altes   aspirin EC 81 MG tablet Stopped by: Riki Altes   bisacodyl 5 MG EC tablet Commonly known as: DULCOLAX Stopped by: Riki Altes    hydrochlorothiazide 12.5 MG capsule Commonly known as: MICROZIDE Stopped by: Riki Altes   oxybutynin 5 MG 24 hr tablet Commonly known as: DITROPAN-XL Stopped by: Riki Altes   oxybutynin 5 MG tablet Commonly known as: DITROPAN Stopped by: Riki Altes   pravastatin 40 MG tablet Commonly known as: PRAVACHOL Stopped by: Riki Altes       TAKE these medications    albuterol 108 (90 Base) MCG/ACT inhaler Commonly known as: VENTOLIN HFA Inhale into the lungs.   amLODipine 10 MG tablet Commonly known as: NORVASC TAKE 1 TABLET BY MOUTH DAILY FOR HIGH BLOOD PRESSURE TO REPLACE 5 MG   atorvastatin 40 MG tablet Commonly known as: LIPITOR Take 40 mg by mouth daily. What changed: Another medication with the same name was removed. Continue taking this medication, and follow the directions you see here. Changed by: Riki Altes   fluticasone 50 MCG/ACT nasal spray Commonly known as: FLONASE SPRAY 1 TO 2 SPRAYS IN EACH NOSTRIL AT BEDTIME   Gemtesa 75 MG Tabs Generic drug: Vibegron Take 1 tablet (75 mg total) by mouth daily. What changed: how much to take Changed by: Riki Altes   losartan 100 MG tablet Commonly known as: COZAAR Take 100 mg by mouth daily. What changed: Another medication with the same name was removed. Continue taking this medication, and  follow the directions you see here. Changed by: Riki Altes   nitroGLYCERIN 0.4 MG SL tablet Commonly known as: NITROSTAT PLACE 1 TAB UNDER TONGUE EVERY 5 MIN AS NEEDED FOR CHEST PAIN-IF USE 3 TABS OR PAIN PERSISTS-GO TO E   omeprazole 20 MG capsule Commonly known as: PRILOSEC Take by mouth.        Allergies:  Allergies  Allergen Reactions   Sulfa Antibiotics Swelling    Family History: Family History  Problem Relation Age of Onset   Pancreatic cancer Mother    Throat cancer Father    Throat cancer Sister     Social History:  reports that she has been smoking cigarettes. She  has been exposed to tobacco smoke. She has never used smokeless tobacco. She reports current alcohol use. She reports that she does not currently use drugs.   Physical Exam: BP 108/66   Pulse (!) 101   Ht 5\' 6"  (1.676 m)   Wt 134 lb (60.8 kg)   BMI 21.63 kg/m   Constitutional:  Alert and oriented, No acute distress. HEENT: [place] AT, moist mucus membranes.  Trachea midline, no masses. Cardiovascular: No clubbing, cyanosis, or edema. Respiratory: Normal respiratory effort, no increased work of breathing. GI: Abdomen is soft, nontender, nondistended, no abdominal masses Skin: No rashes, bruises or suspicious lesions. Neurologic: Grossly intact, no focal deficits, moving all 4 extremities. Psychiatric: Normal mood and affect.  Laboratory Data: Dipstick trace blood/trace leukocytes, microscopy 6-10 WBC   Assessment & Plan:    1. Overactive bladder with urge incontinence Trial Gemtesa 75 mg daily- samples given Discontinue oxybutynin PA follow up 1 month with symptom check and PVR.   2. Pyuria Mild pyuria on UA and urine culture was ordered.  [facility] [address] [place], [place] [address] [phone]

## 2022-11-09 ENCOUNTER — Encounter: Payer: Self-pay | Admitting: Urology

## 2022-12-03 ENCOUNTER — Encounter (INDEPENDENT_AMBULATORY_CARE_PROVIDER_SITE_OTHER): Payer: Self-pay | Admitting: Vascular Surgery

## 2022-12-04 ENCOUNTER — Other Ambulatory Visit (INDEPENDENT_AMBULATORY_CARE_PROVIDER_SITE_OTHER): Payer: Self-pay | Admitting: Nurse Practitioner

## 2022-12-04 DIAGNOSIS — Z9889 Other specified postprocedural states: Secondary | ICD-10-CM

## 2022-12-04 DIAGNOSIS — K551 Chronic vascular disorders of intestine: Secondary | ICD-10-CM

## 2022-12-05 ENCOUNTER — Ambulatory Visit (INDEPENDENT_AMBULATORY_CARE_PROVIDER_SITE_OTHER): Payer: 59 | Admitting: Physician Assistant

## 2022-12-05 ENCOUNTER — Encounter: Payer: Self-pay | Admitting: Physician Assistant

## 2022-12-05 VITALS — BP 145/70 | HR 99 | Wt 132.6 lb

## 2022-12-05 DIAGNOSIS — N3281 Overactive bladder: Secondary | ICD-10-CM

## 2022-12-05 DIAGNOSIS — N3941 Urge incontinence: Secondary | ICD-10-CM | POA: Diagnosis not present

## 2022-12-05 DIAGNOSIS — R350 Frequency of micturition: Secondary | ICD-10-CM | POA: Diagnosis not present

## 2022-12-05 LAB — BLADDER SCAN AMB NON-IMAGING

## 2022-12-05 MED ORDER — GEMTESA 75 MG PO TABS: 1.0000 | ORAL_TABLET | Freq: Every day | ORAL | 11 refills | Status: DC

## 2022-12-05 NOTE — Progress Notes (Signed)
12/05/2022 2:43 PM   [name] 05/12/1955 [id number]  CC: Chief Complaint  Patient presents with   Follow-up   HPI: Rebekah Perry is a 67 y.o. female with PMH OAB wet who presents today for symptom recheck and PVR on Gemtesa.   Today she reports improved urinary frequency, now every 30 to 60 minutes, and urge incontinence, though with persistent urinary urgency and nocturia x 3-4.  She admits that she tries to stay hydrated overnight.  PVR 19mL.  PMH: Past Medical History:  Diagnosis Date   GERD (gastroesophageal reflux disease)    Hypercholesteremia    Hypertension     Surgical History: Past Surgical History:  Procedure Laterality Date   COLONOSCOPY WITH PROPOFOL N/A 01/18/2019   Procedure: COLONOSCOPY WITH PROPOFOL;  Surgeon: [name], MD;  Location: [facility] ENDOSCOPY;  Service: Gastroenterology;  Laterality: N/A;   Stent implant  2009   [facility] hospital  Right Iliac     Home Medications:  Allergies as of 12/05/2022       Reactions   Sulfa Antibiotics Swelling        Medication List        Accurate as of December 05, 2022  2:43 PM. If you have any questions, ask your nurse or doctor.          albuterol 108 (90 Base) MCG/ACT inhaler Commonly known as: VENTOLIN HFA Inhale into the lungs.   amLODipine 10 MG tablet Commonly known as: NORVASC TAKE 1 TABLET BY MOUTH DAILY FOR HIGH BLOOD PRESSURE TO REPLACE 5 MG   atorvastatin 40 MG tablet Commonly known as: LIPITOR Take 40 mg by mouth daily.   fluticasone 50 MCG/ACT nasal spray Commonly known as: FLONASE SPRAY 1 TO 2 SPRAYS IN EACH NOSTRIL AT BEDTIME   Gemtesa 75 MG Tabs Generic drug: Vibegron Take 1 tablet (75 mg total) by mouth daily.   losartan 100 MG tablet Commonly known as: COZAAR Take 100 mg by mouth daily.   nitroGLYCERIN 0.4 MG SL tablet Commonly known as: NITROSTAT PLACE 1 TAB UNDER TONGUE EVERY 5 MIN AS NEEDED FOR CHEST PAIN-IF USE 3 TABS OR PAIN PERSISTS-GO TO E   omeprazole 20  MG capsule Commonly known as: PRILOSEC Take by mouth.        Allergies:  Allergies  Allergen Reactions   Sulfa Antibiotics Swelling    Family History: Family History  Problem Relation Age of Onset   Pancreatic cancer Mother    Throat cancer Father    Throat cancer Sister     Social History:   reports that she has been smoking cigarettes. She has been exposed to tobacco smoke. She has never used smokeless tobacco. She reports current alcohol use. She reports that she does not currently use drugs.  Physical Exam: BP (!) 145/70   Pulse 99   Wt 132 lb 9.6 oz (60.1 kg)   BMI 21.40 kg/m   Constitutional:  Alert and oriented, no acute distress, nontoxic appearing HEENT: [place], AT Cardiovascular: No clubbing, cyanosis, or edema Respiratory: Normal respiratory effort, no increased work of breathing Skin: No rashes, bruises or suspicious lesions Neurologic: Grossly intact, no focal deficits, moving all 4 extremities Psychiatric: Normal mood and affect  Laboratory Data: Results for orders placed or performed in visit on 12/05/22  Bladder Scan (Post Void Residual) in office  Result Value Ref Range   Scan Result 19ml    Assessment & Plan:   1. Overactive bladder Some improvement on Gemtesa, but still having urgency, frequency,  and urge incontinence.  We discussed staying on Gemtesa longer with symptom recheck and she is in agreement with this plan.  Will see her back in clinic in 3 months for symptom recheck and PVR.  If not yet at treatment goal, may consider augmenting with third line therapies. - Bladder Scan (Post Void Residual) in office - Vibegron (GEMTESA) 75 MG TABS; Take 1 tablet (75 mg total) by mouth daily.  Dispense: 30 tablet; Refill: 11   Return in about 3 months (around 03/07/2023) for Symptom recheck with PVR.  Carman Ching, PA-C  [facility] [place] [address] [place], [place] [address] [phone]

## 2022-12-08 ENCOUNTER — Encounter (INDEPENDENT_AMBULATORY_CARE_PROVIDER_SITE_OTHER): Payer: Self-pay | Admitting: Vascular Surgery

## 2022-12-08 ENCOUNTER — Ambulatory Visit (INDEPENDENT_AMBULATORY_CARE_PROVIDER_SITE_OTHER): Payer: 59

## 2022-12-08 ENCOUNTER — Ambulatory Visit (INDEPENDENT_AMBULATORY_CARE_PROVIDER_SITE_OTHER): Payer: 59 | Admitting: Vascular Surgery

## 2022-12-08 VITALS — BP 145/69 | HR 74 | Resp 16 | Wt 131.4 lb

## 2022-12-08 DIAGNOSIS — E782 Mixed hyperlipidemia: Secondary | ICD-10-CM | POA: Diagnosis not present

## 2022-12-08 DIAGNOSIS — K551 Chronic vascular disorders of intestine: Secondary | ICD-10-CM

## 2022-12-08 DIAGNOSIS — Z9889 Other specified postprocedural states: Secondary | ICD-10-CM

## 2022-12-08 DIAGNOSIS — K219 Gastro-esophageal reflux disease without esophagitis: Secondary | ICD-10-CM | POA: Diagnosis not present

## 2022-12-08 DIAGNOSIS — I739 Peripheral vascular disease, unspecified: Secondary | ICD-10-CM

## 2022-12-08 DIAGNOSIS — I70213 Atherosclerosis of native arteries of extremities with intermittent claudication, bilateral legs: Secondary | ICD-10-CM

## 2022-12-08 LAB — VAS US ABI WITH/WO TBI
Left ABI: 0.58
Right ABI: 0.55

## 2022-12-08 NOTE — Progress Notes (Signed)
MRN : [id number]  Rebekah Perry is a 67 y.o. ([birth year]) female who presents with chief complaint of check circulation.  History of Present Illness:  The patient returns to the office for followup and review of the noninvasive studies. There have been no interval changes in lower extremity symptoms, she denies any significant claudication or worsening of her claudication.  No interval shortening of the patient's claudication distance or worsening of rest pain symptoms. No new ulcers or wounds have occurred since the last visit.   Patient denies postprandial abdominal pain or weight loss.  She denies any nausea or vomiting   There have been no significant changes to the patient's overall health care.   The patient denies amaurosis fugax or recent TIA symptoms. There are no recent neurological changes noted. The patient denies history of DVT, PE or superficial thrombophlebitis. The patient denies recent episodes of angina or shortness of breath.    Previous CT angiogram in 2021 notes the following, There are 4 salient points based on this study.  #1 previously placed stents within the iliac system are occluded.  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  #4 the bilateral common femoral arteries and the distal runoff is preserved and there is minimal disease noted on the CT scan from the level of the femorals down to the feet.   ABI's dated 12/08/2022 show Rt=0.55 monophasic and Lt=0.58 monophasic.  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   Current Meds  Medication Sig   albuterol (PROVENTIL HFA;VENTOLIN HFA) 108 (90 Base) MCG/ACT inhaler Inhale into the lungs.   amLODipine (NORVASC) 10 MG  tablet TAKE 1 TABLET BY MOUTH DAILY FOR HIGH BLOOD PRESSURE TO REPLACE 5 MG   atorvastatin (LIPITOR) 40 MG tablet Take 40 mg by mouth daily.   fluticasone (FLONASE) 50 MCG/ACT nasal spray SPRAY 1 TO 2 SPRAYS IN EACH NOSTRIL AT BEDTIME   losartan (COZAAR) 100 MG tablet Take 100 mg by mouth daily.   nitroGLYCERIN (NITROSTAT) 0.4 MG SL tablet PLACE 1 TAB UNDER TONGUE EVERY 5 MIN AS NEEDED FOR CHEST PAIN-IF USE 3 TABS OR PAIN PERSISTS-GO TO E   omeprazole (PRILOSEC) 20 MG capsule Take by mouth.   Vibegron (GEMTESA) 75 MG TABS Take 1 tablet (75 mg total) by mouth daily.    Past Medical History:  Diagnosis Date   GERD (gastroesophageal reflux disease)    Hypercholesteremia    Hypertension     Past Surgical History:  Procedure Laterality Date   COLONOSCOPY WITH PROPOFOL N/A 01/18/2019   Procedure: COLONOSCOPY WITH PROPOFOL;  Surgeon: [name], MD;  Location: [facility] ENDOSCOPY;  Service: Gastroenterology;  Laterality: N/A;   Stent implant  2009   [facility]  Right Iliac     Social History Social History   Tobacco Use   Smoking status: Every Day    Types: Cigarettes  Passive exposure: Current   Smokeless tobacco: Never  Substance Use Topics   Alcohol use: Yes    Comment: Occ   Drug use: Not Currently    Family History Family History  Problem Relation Age of Onset   Pancreatic cancer Mother    Throat cancer Father    Throat cancer Sister     Allergies  Allergen Reactions   Sulfa Antibiotics Swelling     REVIEW OF SYSTEMS (Negative unless checked)  Constitutional: [] Weight loss  [] Fever  [] Chills Cardiac: [] Chest pain   [] Chest pressure   [] Palpitations   [] Shortness of breath when laying flat   [] Shortness of breath with exertion. Vascular:  [x] Pain in legs with walking   [] Pain in legs at rest  [] History of DVT   [] Phlebitis   [] Swelling in legs   [] Varicose veins   [] Non-healing ulcers Pulmonary:   [] Uses home oxygen   [] Productive cough   [] Hemoptysis   [] Wheeze   [] COPD   [] Asthma Neurologic:  [] Dizziness   [] Seizures   [] History of stroke   [] History of TIA  [] Aphasia   [] Vissual changes   [] Weakness or numbness in arm   [] Weakness or numbness in leg Musculoskeletal:   [] Joint swelling   [] Joint pain   [] Low back pain Hematologic:  [] Easy bruising  [] Easy bleeding   [] Hypercoagulable state   [] Anemic Gastrointestinal:  [] Diarrhea   [] Vomiting  [x] Gastroesophageal reflux/heartburn   [] Difficulty swallowing. Genitourinary:  [] Chronic kidney disease   [] Difficult urination  [] Frequent urination   [] Blood in urine Skin:  [] Rashes   [] Ulcers  Psychological:  [] History of anxiety   []  History of major depression.  Physical Examination  Vitals:   12/08/22 0936  BP: (!) 145/69  Pulse: 74  Resp: 16  Weight: 131 lb 6.4 oz (59.6 kg)   Body mass index is 21.21 kg/m. Gen: WD/WN, NAD Head: [place]/AT, No temporalis wasting.  Ear/Nose/Throat: Hearing grossly intact, nares w/o erythema or drainage Eyes: PER, EOMI, sclera nonicteric.  Neck: Supple, no masses.  No bruit or JVD.  Pulmonary:  Good air movement, no audible wheezing, no use of accessory muscles.  Cardiac: RRR, normal S1, S2, no Murmurs. Vascular:  mild trophic changes, no open wounds Vessel Right Left  Radial Palpable Palpable  PT Not Palpable Not Palpable  DP Not Palpable Not Palpable  Gastrointestinal: soft, non-distended. No guarding/no peritoneal signs.  Musculoskeletal: M/S 5/5 throughout.  No visible deformity.  Neurologic: CN 2-12 intact. Pain and light touch intact in extremities.  Symmetrical.  Speech is fluent. Motor exam as listed above. Psychiatric: Judgment intact, Mood & affect appropriate for pt's clinical situation. Dermatologic: No rashes or ulcers noted.  No changes consistent with cellulitis.   CBC Lab Results  Component Value Date   WBC 8.4 04/13/2018   HGB 12.9 04/13/2018   HCT 39.2 04/13/2018   MCV 84 04/13/2018   PLT 318 04/13/2018    BMET    Component Value  Date/Time   NA 144 04/13/2018 0905   K 3.9 04/13/2018 0905   CL 104 04/13/2018 0905   CO2 24 04/13/2018 0905   GLUCOSE 97 04/13/2018 0905   BUN 8 04/13/2018 0905   CREATININE 0.81 01/26/2020 1013   CALCIUM 9.7 04/13/2018 0905   GFRNONAA >60 01/26/2020 1013   GFRAA 100 04/13/2018 0905   CrCl cannot be calculated (Patient's most recent lab result is older than the maximum 21 days allowed.).  COAG No results found for: "INR", "PROTIME"  Radiology No results found.  Assessment/Plan 1. Atherosclerosis of native artery of both lower extremities with intermittent claudication (HCC) Recommend:   The patient has evidence of atherosclerosis of the lower extremities with claudication.  The patient does not voice lifestyle limiting changes at this point in time.   Noninvasive studies do not suggest clinically significant change.   No invasive studies, angiography or surgery at this time The patient should continue walking and begin a more formal exercise program.  The patient should continue antiplatelet therapy and aggressive treatment of the lipid abnormalities   No changes in the patient's medications at this time   Continued surveillance is indicated as atherosclerosis is likely to progress with time.     The patient will continue follow up with noninvasive studies as ordered.   - VAS [place] ABI WITH/WO TBI; Future  2. Chronic mesenteric ischemia (HCC) Recommend:   The patient has evidence of chronic asymptomatic mesenteric atherosclerosis.  The patient denies lifestyle limiting changes at this point in time.  Given the lack of symptoms no intervention is warranted at this time.    No further invasive studies, angiography or surgery at this time   The patient should continue walking and begin a more formal exercise program.   The patient should continue antiplatelet therapy and aggressive treatment of the lipid abnormalities   Patient should undergo noninvasive studies as  ordered. The patient will follow up with me after the studies.    3. Gastroesophageal reflux disease, unspecified whether esophagitis present Continue PPI as already ordered, this medication has been reviewed and there are no changes at this time.  Avoidence of caffeine and alcohol  Moderate elevation of the head of the bed   4. Mixed hyperlipidemia Continue statin as ordered and reviewed, no changes at this time    Levora Dredge, MD  12/08/2022 9:40 AM

## 2023-03-09 ENCOUNTER — Ambulatory Visit: Payer: 59 | Admitting: Physician Assistant

## 2023-04-23 IMAGING — MG MM DIGITAL SCREENING BILAT W/ TOMO AND CAD
8 series · 8 of 24 positions shown · non-contrast
Comparison: Previous exam(s).

CLINICAL DATA: Screening.

EXAM:
DIGITAL SCREENING BILATERAL MAMMOGRAM WITH TOMOSYNTHESIS AND CAD
TECHNIQUE: Bilateral screening digital craniocaudal and mediolateral oblique
mammograms were obtained. Bilateral screening digital breast
tomosynthesis was performed. The images were evaluated with
computer-aided detection.

[R MLO synth-2D]
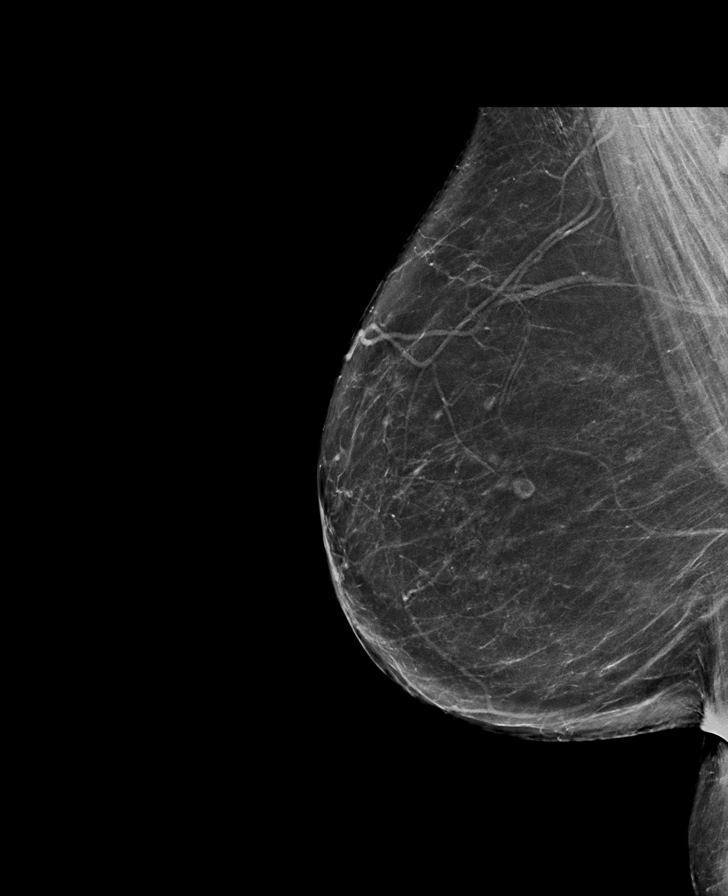

[L CC synth-2D]
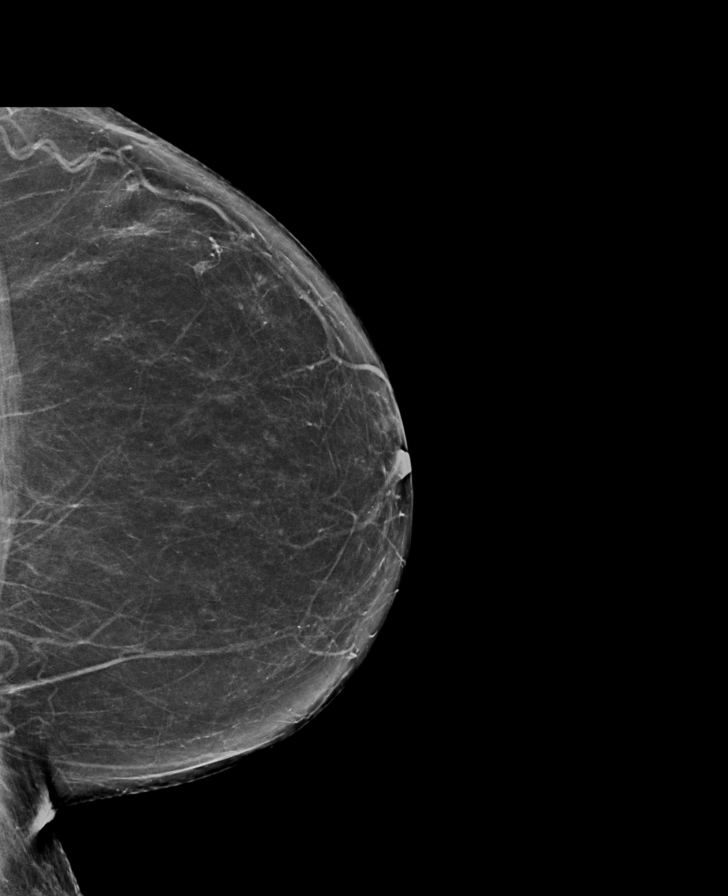

[L MLO synth-2D]
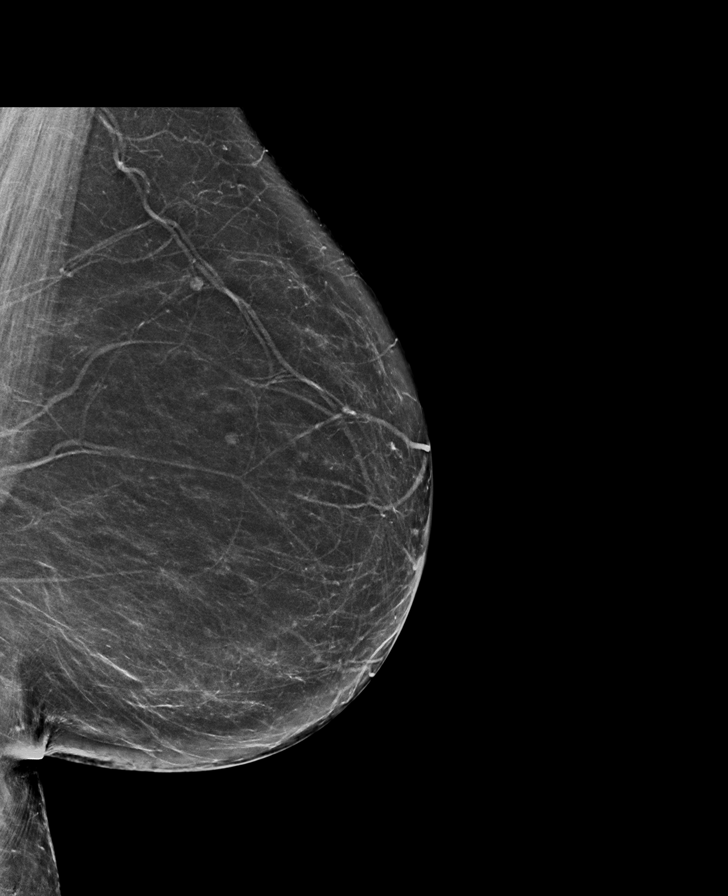

[R CC synth-2D]
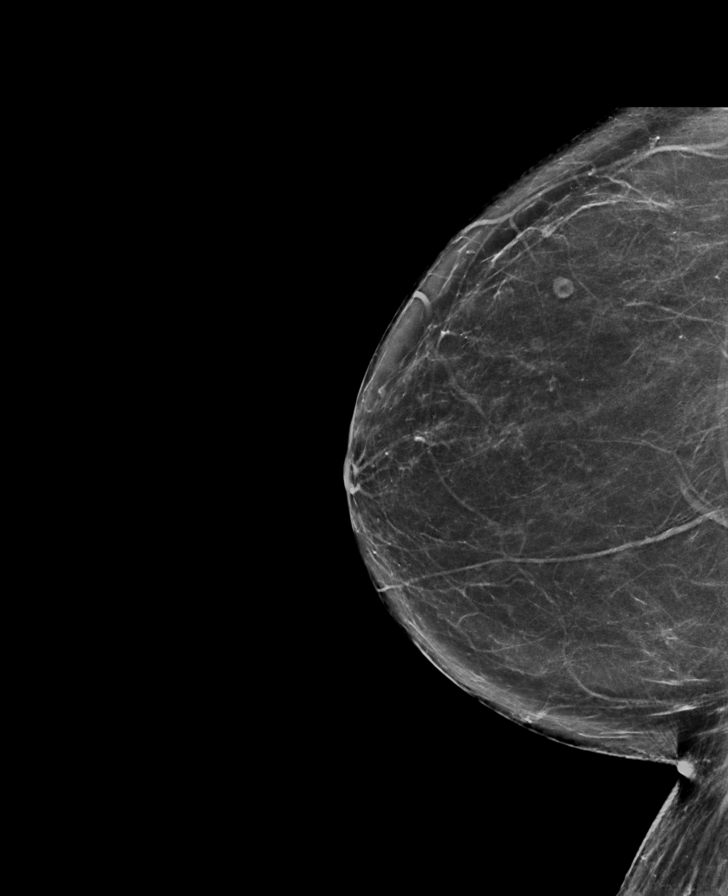

[L CC tomo · tomo slice 36/71.0]
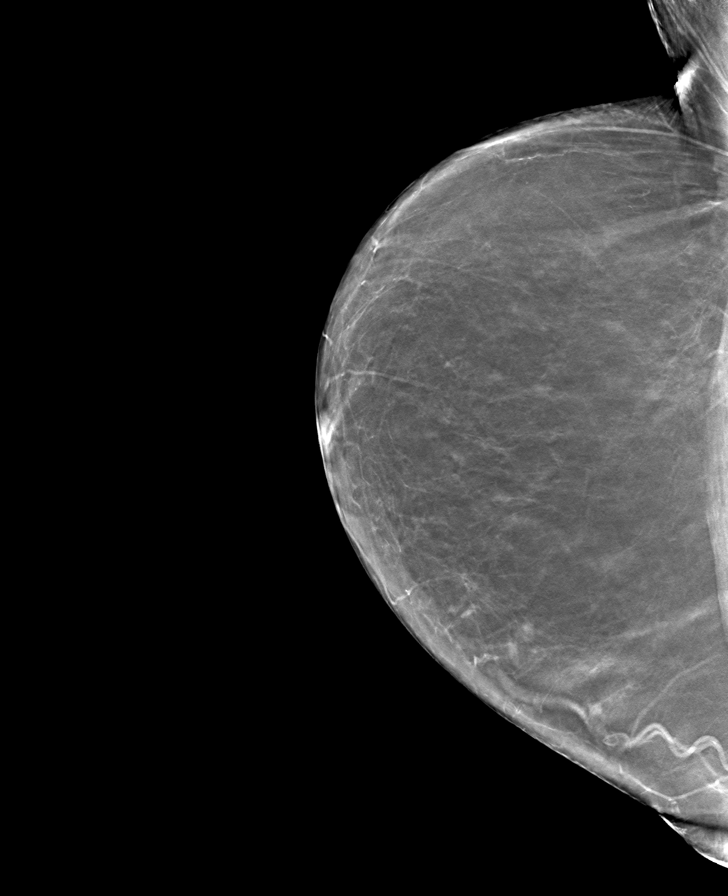

[R CC tomo · tomo slice 35/69.0]
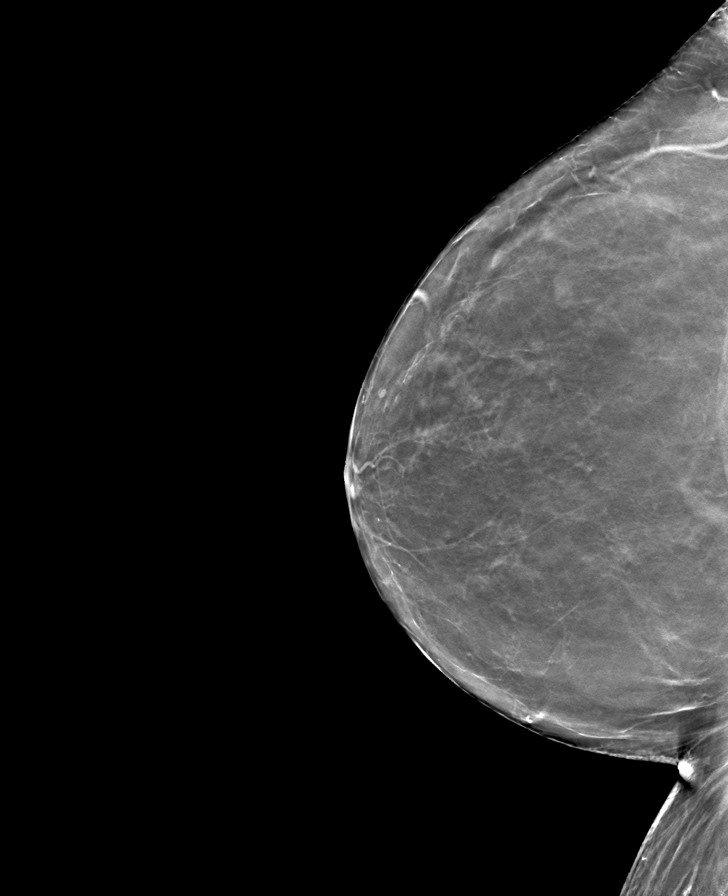

[R MLO tomo · tomo slice 37/74.0]
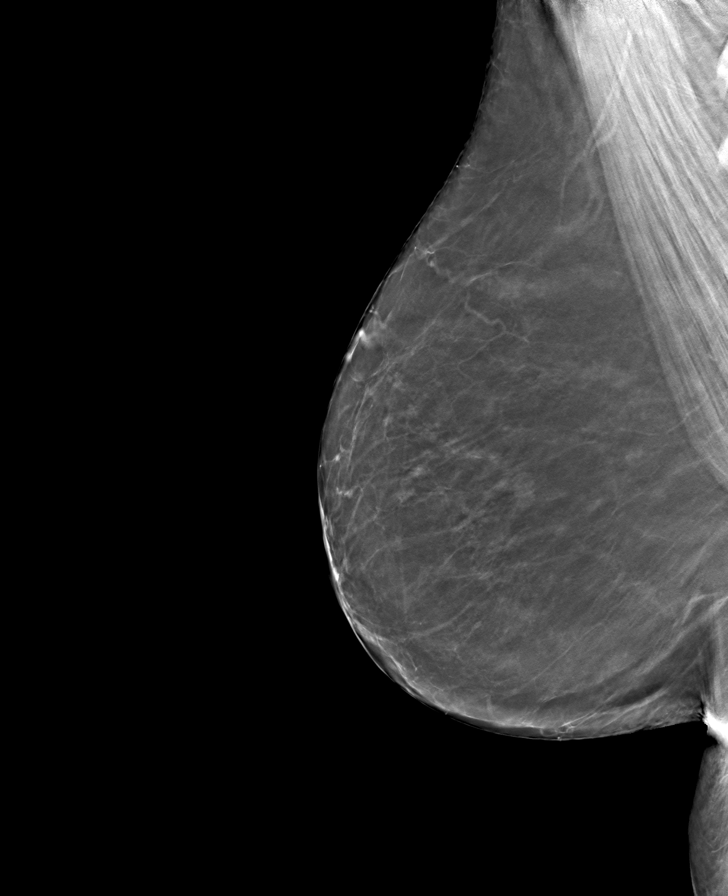

[L MLO tomo · tomo slice 37/72.0]
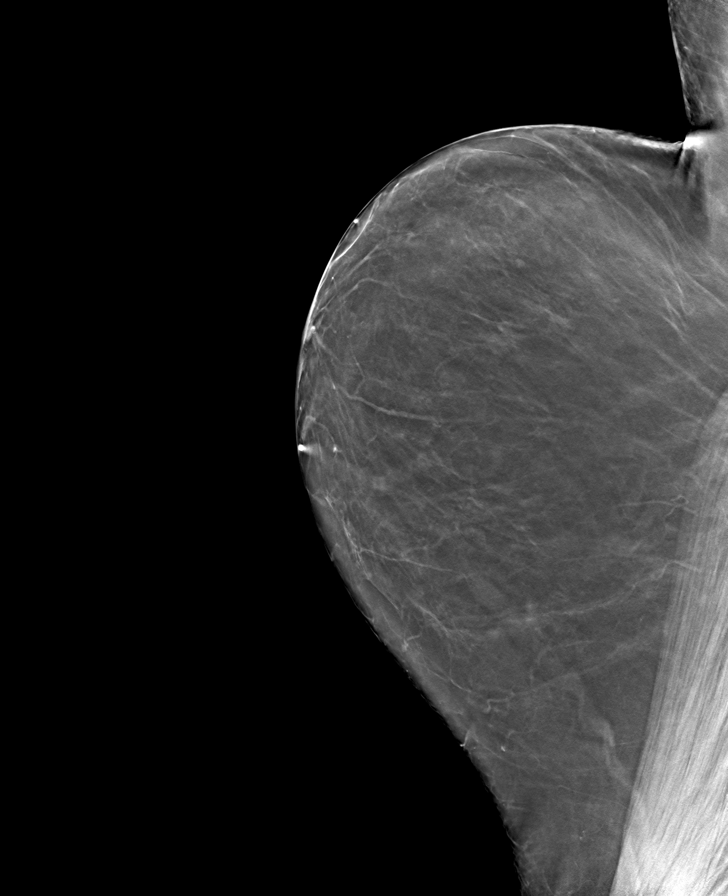

[8 of 24 positions shown; findings below may reference images not displayed]

ACR Breast Density Category b: There are scattered areas of
fibroglandular density.
FINDINGS: There are no findings suspicious for malignancy.
IMPRESSION: No mammographic evidence of malignancy. A result letter of this
screening mammogram will be mailed directly to the patient.

RECOMMENDATION:
Screening mammogram in one year. (Code:51-O-LD2)

BI-RADS CATEGORY  1: Negative.

## 2023-05-12 ENCOUNTER — Ambulatory Visit (INDEPENDENT_AMBULATORY_CARE_PROVIDER_SITE_OTHER): Payer: 59 | Admitting: Physician Assistant

## 2023-05-12 VITALS — BP 160/74 | HR 92 | Ht 64.0 in | Wt 145.0 lb

## 2023-05-12 DIAGNOSIS — N958 Other specified menopausal and perimenopausal disorders: Secondary | ICD-10-CM | POA: Diagnosis not present

## 2023-05-12 DIAGNOSIS — N3281 Overactive bladder: Secondary | ICD-10-CM

## 2023-05-12 LAB — BLADDER SCAN AMB NON-IMAGING: Scan Result: 5

## 2023-05-12 MED ORDER — GEMTESA 75 MG PO TABS: 1.0000 | ORAL_TABLET | Freq: Every day | ORAL | 11 refills | Status: AC

## 2023-05-12 MED ORDER — ESTRADIOL 0.1 MG/GM VA CREA: TOPICAL_CREAM | VAGINAL | 12 refills | Status: AC

## 2023-05-12 NOTE — Progress Notes (Signed)
05/12/2023 10:13 AM   Rebekah Perry December 28, 1955 [id number]  CC: Chief Complaint  Patient presents with   Follow-up   HPI: Rebekah Perry is a 68 y.o. female with PMH OAB wet who presents today for follow-up on Gemtesa.   Today she reports significant symptomatic improvement on Gemtesa.  She is now dry.  She denies nocturia.  She reports some dyspareunia due to dryness as well as vulvovaginal irritation.  PVR 5 mL.  PMH: Past Medical History:  Diagnosis Date   GERD (gastroesophageal reflux disease)    Hypercholesteremia    Hypertension     Surgical History: Past Surgical History:  Procedure Laterality Date   COLONOSCOPY WITH PROPOFOL N/A 01/18/2019   Procedure: COLONOSCOPY WITH PROPOFOL;  Surgeon: [name], MD;  Location: [facility] ENDOSCOPY;  Service: Gastroenterology;  Laterality: N/A;   Stent implant  2009   [facility] hospital  Right Iliac     Home Medications:  Allergies as of 05/12/2023       Reactions   Sulfa Antibiotics Swelling        Medication List        Accurate as of May 12, 2023 10:13 AM. If you have any questions, ask your nurse or doctor.          albuterol 108 (90 Base) MCG/ACT inhaler Commonly known as: VENTOLIN HFA Inhale into the lungs.   amLODipine 10 MG tablet Commonly known as: NORVASC TAKE 1 TABLET BY MOUTH DAILY FOR HIGH BLOOD PRESSURE TO REPLACE 5 MG   atorvastatin 40 MG tablet Commonly known as: LIPITOR Take 40 mg by mouth daily.   fluticasone 50 MCG/ACT nasal spray Commonly known as: FLONASE SPRAY 1 TO 2 SPRAYS IN EACH NOSTRIL AT BEDTIME   Gemtesa 75 MG Tabs Generic drug: Vibegron Take 1 tablet (75 mg total) by mouth daily.   losartan 100 MG tablet Commonly known as: COZAAR Take 100 mg by mouth daily.   nitroGLYCERIN 0.4 MG SL tablet Commonly known as: NITROSTAT PLACE 1 TAB UNDER TONGUE EVERY 5 MIN AS NEEDED FOR CHEST PAIN-IF USE 3 TABS OR PAIN PERSISTS-GO TO E   omeprazole 20 MG capsule Commonly known as:  PRILOSEC Take by mouth.        Allergies:  Allergies  Allergen Reactions   Sulfa Antibiotics Swelling    Family History: Family History  Problem Relation Age of Onset   Pancreatic cancer Mother    Throat cancer Father    Throat cancer Sister     Social History:   reports that she has been smoking cigarettes. She has been exposed to tobacco smoke. She has never used smokeless tobacco. She reports current alcohol use. She reports that she does not currently use drugs.  Physical Exam: BP (!) 160/74   Pulse 92   Ht 5\' 4"  (1.626 m)   Wt 145 lb (65.8 kg)   BMI 24.89 kg/m   Constitutional:  Alert and oriented, no acute distress, nontoxic appearing HEENT: [place], AT Cardiovascular: No clubbing, cyanosis, or edema Respiratory: Normal respiratory effort, no increased work of breathing Skin: No rashes, bruises or suspicious lesions Neurologic: Grossly intact, no focal deficits, moving all 4 extremities Psychiatric: Normal mood and affect  Laboratory Data: Results for orders placed or performed in visit on 05/12/23  BLADDER SCAN AMB NON-IMAGING   Collection Time: 05/12/23  9:29 AM  Result Value Ref Range   Scan Result 5 ml    Assessment & Plan:   1. Overactive bladder (Primary) Symptoms well-controlled on [place],  now dry.  She is emptying appropriately.  Continue Gemtesa. - BLADDER SCAN AMB NON-IMAGING - Vibegron (GEMTESA) 75 MG TABS; Take 1 tablet (75 mg total) by mouth daily.  Dispense: 30 tablet; Refill: 11  2. Genitourinary syndrome of menopause We discussed the role of lost estrogen and menopause in contributing to vaginal dryness, vulvovaginal irritation, and dyspareunia.  I recommended starting topical vaginal estrogen cream, and we discussed using nonhormonal vaginal moisturizers and personal lubricants as well for symptom control. - estradiol (ESTRACE) 0.1 MG/GM vaginal cream; Apply one pea-sized amount around the opening of the urethra daily for 2 weeks, then 3 times  weekly moving forward.  Dispense: 42.5 g; Refill: 12   Return in about 1 year (around 05/11/2024) for Annual OAB f/u with PVR.  Carman Ching, PA-C  [facility] [place] [address] [place], [place] [address] [phone]

## 2023-05-12 NOTE — Patient Instructions (Signed)
Continue Gemtesa once daily. Start Estrace (estradiol) estrogen cream. Apply one pea-sized amount around the opening of the urethra (where your urine comes out) every day for 2 weeks, then every Monday, Wednesday, and Friday forever. On days you aren't using the estrogen cream, try the over-the-counter nonhormonal vaginal moisturizer called Replens.

## 2023-06-06 NOTE — Progress Notes (Deleted)
 MRN : [id number]  Rebekah Perry is a 68 y.o. (06/15/1955) female who presents with chief complaint of check circulation.  History of Present Illness:   The patient returns to the office for followup and review of the noninvasive studies. There have been no interval changes in lower extremity symptoms, she denies any significant claudication or worsening of her claudication.  No interval shortening of the patient's claudication distance or worsening of rest pain symptoms. No new ulcers or wounds have occurred since the last visit.   Patient denies postprandial abdominal pain or weight loss.  She denies any nausea or vomiting   There have been no significant changes to the patient's overall health care.   The patient denies amaurosis fugax or recent TIA symptoms. There are no recent neurological changes noted. The patient denies history of DVT, PE or superficial thrombophlebitis. The patient denies recent episodes of angina or shortness of breath.    Previous CT angiogram in 2021 notes the following, There are 4 salient points based on this study.  #1 previously placed stents within the iliac system are occluded.  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  #4 the bilateral common femoral arteries and the distal runoff is preserved and there is minimal disease noted on the CT scan from the level of the femorals down to the feet.   ABI's dated 12/08/2022 show Rt=0.55 monophasic and Lt=0.58 monophasic.  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     No outpatient medications have been marked as taking for the 06/08/23 encounter (Appointment) with Gilda Crease, Latina Craver, MD.    Past Medical History:   Diagnosis Date   GERD (gastroesophageal reflux disease)    Hypercholesteremia    Hypertension     Past Surgical History:  Procedure Laterality Date   COLONOSCOPY WITH PROPOFOL N/A 01/18/2019   Procedure: COLONOSCOPY WITH PROPOFOL;  Surgeon: [name], MD;  Location: [facility] ENDOSCOPY;  Service: Gastroenterology;  Laterality: N/A;   Stent implant  2009   [facility]  Right Iliac     Social History Social History   Tobacco Use   Smoking status: Every Day    Types: Cigarettes    Passive exposure: Current   Smokeless tobacco: Never  Substance Use Topics   Alcohol use: Yes    Comment: Occ   Drug use: Not Currently    Family History Family History  Problem Relation Age of Onset   Pancreatic cancer Mother    Throat cancer Father    Throat cancer Sister     Allergies  Allergen Reactions   Sulfa Antibiotics Swelling     REVIEW OF SYSTEMS (Negative unless checked)  Constitutional: [] Weight loss  [] Fever  [] Chills Cardiac: [] Chest pain   [] Chest pressure   [] Palpitations   [] Shortness of breath when laying flat   [] Shortness of breath with exertion. Vascular:  [x] Pain in legs  with walking   [] Pain in legs at rest  [] History of DVT   [] Phlebitis   [] Swelling in legs   [] Varicose veins   [] Non-healing ulcers Pulmonary:   [] Uses home oxygen   [] Productive cough   [] Hemoptysis   [] Wheeze  [] COPD   [] Asthma Neurologic:  [] Dizziness   [] Seizures   [] History of stroke   [] History of TIA  [] Aphasia   [] Vissual changes   [] Weakness or numbness in arm   [] Weakness or numbness in leg Musculoskeletal:   [] Joint swelling   [] Joint pain   [] Low back pain Hematologic:  [] Easy bruising  [] Easy bleeding   [] Hypercoagulable state   [] Anemic Gastrointestinal:  [] Diarrhea   [] Vomiting  [] Gastroesophageal reflux/heartburn   [] Difficulty swallowing. Genitourinary:  [] Chronic kidney disease   [] Difficult urination  [] Frequent urination   [] Blood in urine Skin:  [] Rashes   [] Ulcers   Psychological:  [] History of anxiety   []  History of major depression.  Physical Examination  There were no vitals filed for this visit. There is no height or weight on file to calculate BMI. Gen: WD/WN, NAD Head: /AT, No temporalis wasting.  Ear/Nose/Throat: Hearing grossly intact, nares w/o erythema or drainage Eyes: PER, EOMI, sclera nonicteric.  Neck: Supple, no masses.  No bruit or JVD.  Pulmonary:  Good air movement, no audible wheezing, no use of accessory muscles.  Cardiac: RRR, normal S1, S2, no Murmurs. Vascular:  mild trophic changes, no open wounds Vessel Right Left  Radial Palpable Palpable  PT Not Palpable Not Palpable  DP Not Palpable Not Palpable  Gastrointestinal: soft, non-distended. No guarding/no peritoneal signs.  Musculoskeletal: M/S 5/5 throughout.  No visible deformity.  Neurologic: CN 2-12 intact. Pain and light touch intact in extremities.  Symmetrical.  Speech is fluent. Motor exam as listed above. Psychiatric: Judgment intact, Mood & affect appropriate for pt's clinical situation. Dermatologic: No rashes or ulcers noted.  No changes consistent with cellulitis.   CBC Lab Results  Component Value Date   WBC 8.4 04/13/2018   HGB 12.9 04/13/2018   HCT 39.2 04/13/2018   MCV 84 04/13/2018   PLT 318 04/13/2018    BMET    Component Value Date/Time   NA 144 04/13/2018 0905   K 3.9 04/13/2018 0905   CL 104 04/13/2018 0905   CO2 24 04/13/2018 0905   GLUCOSE 97 04/13/2018 0905   BUN 8 04/13/2018 0905   CREATININE 0.81 01/26/2020 1013   CALCIUM 9.7 04/13/2018 0905   GFRNONAA >60 01/26/2020 1013   GFRAA 100 04/13/2018 0905   CrCl cannot be calculated (Patient's most recent lab result is older than the maximum 21 days allowed.).  COAG No results found for: "INR", "PROTIME"  Radiology No results found.   Assessment/Plan There are no diagnoses linked to this encounter.   Levora Dredge, MD  06/06/2023 5:01 PM

## 2023-06-08 ENCOUNTER — Other Ambulatory Visit (INDEPENDENT_AMBULATORY_CARE_PROVIDER_SITE_OTHER): Payer: 59

## 2023-06-08 ENCOUNTER — Ambulatory Visit (INDEPENDENT_AMBULATORY_CARE_PROVIDER_SITE_OTHER): Payer: 59 | Admitting: Vascular Surgery

## 2023-06-08 DIAGNOSIS — E782 Mixed hyperlipidemia: Secondary | ICD-10-CM

## 2023-06-08 DIAGNOSIS — I10 Essential (primary) hypertension: Secondary | ICD-10-CM

## 2023-06-08 DIAGNOSIS — I70213 Atherosclerosis of native arteries of extremities with intermittent claudication, bilateral legs: Secondary | ICD-10-CM

## 2023-06-08 DIAGNOSIS — I774 Celiac artery compression syndrome: Secondary | ICD-10-CM

## 2023-08-17 ENCOUNTER — Ambulatory Visit (LOCAL_COMMUNITY_HEALTH_CENTER): Payer: Self-pay

## 2023-08-17 DIAGNOSIS — Z111 Encounter for screening for respiratory tuberculosis: Secondary | ICD-10-CM

## 2023-08-19 ENCOUNTER — Ambulatory Visit (LOCAL_COMMUNITY_HEALTH_CENTER)

## 2023-08-19 ENCOUNTER — Other Ambulatory Visit

## 2023-08-19 DIAGNOSIS — Z111 Encounter for screening for respiratory tuberculosis: Secondary | ICD-10-CM

## 2023-08-19 LAB — TB SKIN TEST
Induration: 0 mm
TB Skin Test: NEGATIVE

## 2023-09-01 ENCOUNTER — Encounter (INDEPENDENT_AMBULATORY_CARE_PROVIDER_SITE_OTHER): Payer: Self-pay

## 2024-02-29 ENCOUNTER — Telehealth: Payer: Self-pay

## 2024-02-29 ENCOUNTER — Other Ambulatory Visit: Payer: Self-pay

## 2024-02-29 DIAGNOSIS — Z1211 Encounter for screening for malignant neoplasm of colon: Secondary | ICD-10-CM

## 2024-02-29 MED ORDER — NA SULFATE-K SULFATE-MG SULF 17.5-3.13-1.6 GM/177ML PO SOLN: 1.0000 | Freq: Once | ORAL | 0 refills | Status: AC

## 2024-02-29 NOTE — Telephone Encounter (Signed)
 Gastroenterology Pre-Procedure Review  Request Date: 05/03/24 Requesting Physician: Dr. Jinny  PATIENT REVIEW QUESTIONS: The patient responded to the following health history questions as indicated:    1. Are you having any GI issues? no 2. Do you have a personal history of Polyps? no 3. Do you have a family history of Colon Cancer or Polyps? no 4. Diabetes Mellitus? no 5. Joint replacements in the past 12 months?no 6. Major health problems in the past 3 months?no 7. Any artificial heart valves, MVP, or defibrillator?no    MEDICATIONS & ALLERGIES:    Patient reports the following regarding taking any anticoagulation/antiplatelet therapy:   Plavix, Coumadin, Eliquis, Xarelto, Lovenox, Pradaxa, Brilinta, or Effient? no Aspirin? no  Patient confirms/reports the following medications:  Current Outpatient Medications  Medication Sig Dispense Refill   albuterol (PROVENTIL HFA;VENTOLIN HFA) 108 (90 Base) MCG/ACT inhaler Inhale into the lungs.     amLODipine (NORVASC) 10 MG tablet TAKE 1 TABLET BY MOUTH DAILY FOR HIGH BLOOD PRESSURE TO REPLACE 5 MG     atorvastatin (LIPITOR) 40 MG tablet Take 40 mg by mouth daily.     estradiol  (ESTRACE ) 0.1 MG/GM vaginal cream Apply one pea-sized amount around the opening of the urethra daily for 2 weeks, then 3 times weekly moving forward. 42.5 g 12   fluticasone (FLONASE) 50 MCG/ACT nasal spray SPRAY 1 TO 2 SPRAYS IN EACH NOSTRIL AT BEDTIME     losartan (COZAAR) 100 MG tablet Take 100 mg by mouth daily.     nitroGLYCERIN (NITROSTAT) 0.4 MG SL tablet PLACE 1 TAB UNDER TONGUE EVERY 5 MIN AS NEEDED FOR CHEST PAIN-IF USE 3 TABS OR PAIN PERSISTS-GO TO E     omeprazole (PRILOSEC) 20 MG capsule Take by mouth.     Vibegron  (GEMTESA ) 75 MG TABS Take 1 tablet (75 mg total) by mouth daily. 30 tablet 11   No current facility-administered medications for this visit.    Patient confirms/reports the following allergies:  Allergies  Allergen Reactions   Sulfa  Antibiotics Swelling    No orders of the defined types were placed in this encounter.   AUTHORIZATION INFORMATION Primary Insurance: 1D#: Group #:  Secondary Insurance: 1D#: Group #:  SCHEDULE INFORMATION: Date: 05/03/24 Time: Location: [facility]

## 2024-05-02 ENCOUNTER — Other Ambulatory Visit: Payer: Self-pay

## 2024-05-02 DIAGNOSIS — Z1211 Encounter for screening for malignant neoplasm of colon: Secondary | ICD-10-CM

## 2024-05-02 MED ORDER — CLENPIQ 10-3.5-12 MG-GM -GM/160ML PO SOLN: 320.0000 mL | ORAL | 0 refills | Status: AC

## 2024-05-03 ENCOUNTER — Encounter: Admission: RE | Payer: Self-pay | Source: Home / Self Care

## 2024-05-03 ENCOUNTER — Ambulatory Visit: Admission: RE | Admit: 2024-05-03 | Source: Home / Self Care | Admitting: Gastroenterology

## 2024-05-03 SURGERY — COLONOSCOPY
Anesthesia: General

## 2024-05-10 ENCOUNTER — Ambulatory Visit: Payer: Self-pay | Admitting: Physician Assistant

## 2024-05-26 ENCOUNTER — Encounter: Admission: RE | Payer: Self-pay | Source: Home / Self Care

## 2024-05-26 ENCOUNTER — Ambulatory Visit: Admission: RE | Admit: 2024-05-26 | Source: Home / Self Care
